# Patient Record
Sex: Female | Born: 1950
Health system: Southern US, Community
[De-identification: ages and names within clinical notes are randomized; demographics above are authoritative.]

## PROBLEM LIST (undated history)

## (undated) DIAGNOSIS — I10 Essential (primary) hypertension: Secondary | ICD-10-CM

## (undated) DIAGNOSIS — IMO0001 Reserved for inherently not codable concepts without codable children: Secondary | ICD-10-CM

## (undated) DIAGNOSIS — G510 Bell's palsy: Secondary | ICD-10-CM

## (undated) DIAGNOSIS — K559 Vascular disorder of intestine, unspecified: Secondary | ICD-10-CM

## (undated) DIAGNOSIS — IMO0002 Reserved for concepts with insufficient information to code with codable children: Secondary | ICD-10-CM

## (undated) DIAGNOSIS — E785 Hyperlipidemia, unspecified: Secondary | ICD-10-CM

## (undated) DIAGNOSIS — N816 Rectocele: Secondary | ICD-10-CM

## (undated) HISTORY — DX: Reserved for inherently not codable concepts without codable children: IMO0001

## (undated) HISTORY — DX: Bell's palsy: G51.0

## (undated) HISTORY — DX: Hyperlipidemia, unspecified: E78.5

## (undated) HISTORY — DX: Rectocele: N81.6

## (undated) HISTORY — DX: Reserved for concepts with insufficient information to code with codable children: IMO0002

## (undated) HISTORY — DX: Essential (primary) hypertension: I10

## (undated) HISTORY — DX: Vascular disorder of intestine, unspecified: K55.9

---
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
## 1972-03-26 HISTORY — PX: APPENDECTOMY: SHX54

## 1988-03-26 HISTORY — PX: TOTAL VAGINAL HYSTERECTOMY: SHX2548

## 1991-04-27 HISTORY — PX: CHOLECYSTECTOMY, LAPAROSCOPIC: SHX56

## 1994-02-23 HISTORY — PX: LAMINECTOMY: SHX219

## 1994-06-25 HISTORY — PX: BACK SURGERY: SHX140

## 1996-03-26 HISTORY — PX: CERVICAL FUSION: SHX112

## 1996-03-26 HISTORY — PX: LAPAROSCOPIC SALPINGO OOPHERECTOMY: SHX5927

## 1997-11-22 ENCOUNTER — Other Ambulatory Visit: Admission: RE | Admit: 1997-11-22 | Discharge: 1997-11-22 | Payer: Self-pay | Admitting: Obstetrics and Gynecology

## 1998-02-16 ENCOUNTER — Encounter: Payer: Self-pay | Admitting: Specialist

## 1998-02-16 ENCOUNTER — Ambulatory Visit (HOSPITAL_COMMUNITY): Admission: RE | Admit: 1998-02-16 | Discharge: 1998-02-16 | Payer: Self-pay | Admitting: Specialist

## 1998-03-21 ENCOUNTER — Ambulatory Visit: Admission: RE | Admit: 1998-03-21 | Discharge: 1998-03-21 | Payer: Self-pay | Admitting: Specialist

## 1998-03-21 ENCOUNTER — Encounter: Payer: Self-pay | Admitting: Specialist

## 1998-11-25 ENCOUNTER — Other Ambulatory Visit: Admission: RE | Admit: 1998-11-25 | Discharge: 1998-11-25 | Payer: Self-pay | Admitting: Obstetrics and Gynecology

## 1999-01-03 ENCOUNTER — Encounter: Payer: Self-pay | Admitting: Gastroenterology

## 1999-01-03 ENCOUNTER — Ambulatory Visit (HOSPITAL_COMMUNITY): Admission: RE | Admit: 1999-01-03 | Discharge: 1999-01-03 | Payer: Self-pay | Admitting: Gastroenterology

## 1999-01-25 HISTORY — PX: LAPAROSCOPIC ABDOMINAL EXPLORATION: SHX6249

## 1999-01-27 ENCOUNTER — Ambulatory Visit (HOSPITAL_COMMUNITY): Admission: RE | Admit: 1999-01-27 | Discharge: 1999-01-27 | Payer: Self-pay | Admitting: Obstetrics and Gynecology

## 1999-09-13 ENCOUNTER — Ambulatory Visit (HOSPITAL_COMMUNITY): Admission: RE | Admit: 1999-09-13 | Discharge: 1999-09-13 | Payer: Self-pay | Admitting: Specialist

## 1999-09-13 ENCOUNTER — Encounter: Payer: Self-pay | Admitting: Specialist

## 2000-01-02 ENCOUNTER — Ambulatory Visit (HOSPITAL_COMMUNITY): Admission: RE | Admit: 2000-01-02 | Discharge: 2000-01-02 | Payer: Self-pay | Admitting: Gastroenterology

## 2000-04-10 ENCOUNTER — Other Ambulatory Visit: Admission: RE | Admit: 2000-04-10 | Discharge: 2000-04-10 | Payer: Self-pay | Admitting: *Deleted

## 2000-11-24 HISTORY — PX: BREAST REDUCTION SURGERY: SHX8

## 2000-12-03 ENCOUNTER — Encounter (INDEPENDENT_AMBULATORY_CARE_PROVIDER_SITE_OTHER): Payer: Self-pay | Admitting: *Deleted

## 2000-12-03 ENCOUNTER — Ambulatory Visit (HOSPITAL_BASED_OUTPATIENT_CLINIC_OR_DEPARTMENT_OTHER): Admission: RE | Admit: 2000-12-03 | Discharge: 2000-12-04 | Payer: Self-pay | Admitting: Plastic Surgery

## 2001-09-01 ENCOUNTER — Ambulatory Visit (HOSPITAL_COMMUNITY): Admission: RE | Admit: 2001-09-01 | Discharge: 2001-09-01 | Payer: Self-pay | Admitting: Neurology

## 2001-09-01 ENCOUNTER — Encounter: Payer: Self-pay | Admitting: Neurology

## 2001-09-16 ENCOUNTER — Encounter: Payer: Self-pay | Admitting: Neurology

## 2001-09-16 ENCOUNTER — Encounter: Admission: RE | Admit: 2001-09-16 | Discharge: 2001-09-16 | Payer: Self-pay | Admitting: Neurology

## 2001-10-03 ENCOUNTER — Encounter: Payer: Self-pay | Admitting: Neurology

## 2001-10-03 ENCOUNTER — Encounter: Admission: RE | Admit: 2001-10-03 | Discharge: 2001-10-03 | Payer: Self-pay | Admitting: Neurology

## 2001-10-20 ENCOUNTER — Encounter: Admission: RE | Admit: 2001-10-20 | Discharge: 2001-10-20 | Payer: Self-pay | Admitting: Neurology

## 2001-10-20 ENCOUNTER — Encounter: Payer: Self-pay | Admitting: Neurology

## 2001-12-24 HISTORY — PX: NECK SURGERY: SHX720

## 2001-12-25 ENCOUNTER — Encounter: Payer: Self-pay | Admitting: Neurosurgery

## 2001-12-25 ENCOUNTER — Ambulatory Visit (HOSPITAL_COMMUNITY): Admission: RE | Admit: 2001-12-25 | Discharge: 2001-12-25 | Payer: Self-pay | Admitting: Neurosurgery

## 2001-12-30 ENCOUNTER — Emergency Department (HOSPITAL_COMMUNITY): Admission: EM | Admit: 2001-12-30 | Discharge: 2001-12-31 | Payer: Self-pay | Admitting: Emergency Medicine

## 2001-12-31 ENCOUNTER — Encounter: Payer: Self-pay | Admitting: Emergency Medicine

## 2002-01-15 ENCOUNTER — Encounter: Payer: Self-pay | Admitting: Neurosurgery

## 2002-01-20 ENCOUNTER — Ambulatory Visit (HOSPITAL_COMMUNITY): Admission: RE | Admit: 2002-01-20 | Discharge: 2002-01-21 | Payer: Self-pay | Admitting: Neurosurgery

## 2002-01-20 ENCOUNTER — Encounter: Payer: Self-pay | Admitting: Neurosurgery

## 2002-02-23 ENCOUNTER — Encounter: Payer: Self-pay | Admitting: Neurosurgery

## 2002-02-23 ENCOUNTER — Encounter: Admission: RE | Admit: 2002-02-23 | Discharge: 2002-02-23 | Payer: Self-pay | Admitting: Neurosurgery

## 2002-04-24 ENCOUNTER — Encounter: Payer: Self-pay | Admitting: Neurosurgery

## 2002-04-24 ENCOUNTER — Encounter: Admission: RE | Admit: 2002-04-24 | Discharge: 2002-04-24 | Payer: Self-pay | Admitting: Neurosurgery

## 2002-06-25 DIAGNOSIS — G510 Bell's palsy: Secondary | ICD-10-CM

## 2002-06-25 HISTORY — DX: Bell's palsy: G51.0

## 2002-09-10 ENCOUNTER — Encounter: Payer: Self-pay | Admitting: Gastroenterology

## 2002-09-10 ENCOUNTER — Encounter: Admission: RE | Admit: 2002-09-10 | Discharge: 2002-09-10 | Payer: Self-pay | Admitting: Gastroenterology

## 2002-10-16 ENCOUNTER — Ambulatory Visit (HOSPITAL_COMMUNITY): Admission: RE | Admit: 2002-10-16 | Discharge: 2002-10-16 | Payer: Self-pay | Admitting: Gastroenterology

## 2002-12-07 ENCOUNTER — Ambulatory Visit (HOSPITAL_COMMUNITY): Admission: RE | Admit: 2002-12-07 | Discharge: 2002-12-07 | Payer: Self-pay | Admitting: Urology

## 2002-12-07 ENCOUNTER — Encounter (INDEPENDENT_AMBULATORY_CARE_PROVIDER_SITE_OTHER): Payer: Self-pay | Admitting: Specialist

## 2002-12-07 ENCOUNTER — Ambulatory Visit (HOSPITAL_BASED_OUTPATIENT_CLINIC_OR_DEPARTMENT_OTHER): Admission: RE | Admit: 2002-12-07 | Discharge: 2002-12-07 | Payer: Self-pay | Admitting: Urology

## 2003-03-09 ENCOUNTER — Ambulatory Visit (HOSPITAL_COMMUNITY): Admission: RE | Admit: 2003-03-09 | Discharge: 2003-03-09 | Payer: Self-pay | Admitting: Gastroenterology

## 2003-04-16 ENCOUNTER — Ambulatory Visit (HOSPITAL_COMMUNITY): Admission: RE | Admit: 2003-04-16 | Discharge: 2003-04-16 | Payer: Self-pay | Admitting: Gastroenterology

## 2003-04-16 ENCOUNTER — Encounter (INDEPENDENT_AMBULATORY_CARE_PROVIDER_SITE_OTHER): Payer: Self-pay | Admitting: *Deleted

## 2003-06-14 ENCOUNTER — Ambulatory Visit (HOSPITAL_COMMUNITY): Admission: RE | Admit: 2003-06-14 | Discharge: 2003-06-15 | Payer: Self-pay | Admitting: Neurosurgery

## 2003-07-26 ENCOUNTER — Encounter: Admission: RE | Admit: 2003-07-26 | Discharge: 2003-07-26 | Payer: Self-pay | Admitting: Neurosurgery

## 2003-08-17 ENCOUNTER — Encounter: Admission: RE | Admit: 2003-08-17 | Discharge: 2003-08-17 | Payer: Self-pay | Admitting: Neurosurgery

## 2003-10-21 ENCOUNTER — Encounter: Admission: RE | Admit: 2003-10-21 | Discharge: 2003-10-21 | Payer: Self-pay | Admitting: Neurosurgery

## 2003-12-09 ENCOUNTER — Encounter: Admission: RE | Admit: 2003-12-09 | Discharge: 2003-12-09 | Payer: Self-pay | Admitting: Neurosurgery

## 2004-01-31 ENCOUNTER — Encounter: Admission: RE | Admit: 2004-01-31 | Discharge: 2004-01-31 | Payer: Self-pay | Admitting: Neurology

## 2004-03-15 ENCOUNTER — Ambulatory Visit (HOSPITAL_COMMUNITY): Admission: RE | Admit: 2004-03-15 | Discharge: 2004-03-16 | Payer: Self-pay | Admitting: Neurosurgery

## 2004-04-18 ENCOUNTER — Encounter: Admission: RE | Admit: 2004-04-18 | Discharge: 2004-04-18 | Payer: Self-pay | Admitting: Neurosurgery

## 2004-07-11 ENCOUNTER — Encounter: Admission: RE | Admit: 2004-07-11 | Discharge: 2004-07-11 | Payer: Self-pay | Admitting: Neurosurgery

## 2004-08-15 ENCOUNTER — Encounter: Admission: RE | Admit: 2004-08-15 | Discharge: 2004-08-15 | Payer: Self-pay | Admitting: Neurosurgery

## 2004-11-08 ENCOUNTER — Encounter: Admission: RE | Admit: 2004-11-08 | Discharge: 2004-11-08 | Payer: Self-pay | Admitting: Neurosurgery

## 2004-11-28 ENCOUNTER — Encounter: Admission: RE | Admit: 2004-11-28 | Discharge: 2004-12-13 | Payer: Self-pay | Admitting: Family Medicine

## 2005-02-18 ENCOUNTER — Encounter: Admission: RE | Admit: 2005-02-18 | Discharge: 2005-02-18 | Payer: Self-pay | Admitting: Family Medicine

## 2005-06-12 ENCOUNTER — Encounter: Admission: RE | Admit: 2005-06-12 | Discharge: 2005-06-12 | Payer: Self-pay | Admitting: Gastroenterology

## 2005-06-23 ENCOUNTER — Encounter: Admission: RE | Admit: 2005-06-23 | Discharge: 2005-06-23 | Payer: Self-pay | Admitting: Gastroenterology

## 2006-08-02 ENCOUNTER — Encounter: Admission: RE | Admit: 2006-08-02 | Discharge: 2006-08-02 | Payer: Self-pay | Admitting: Neurosurgery

## 2006-08-20 ENCOUNTER — Encounter: Admission: RE | Admit: 2006-08-20 | Discharge: 2006-08-20 | Payer: Self-pay | Admitting: Neurosurgery

## 2006-12-25 DIAGNOSIS — K559 Vascular disorder of intestine, unspecified: Secondary | ICD-10-CM

## 2006-12-25 HISTORY — DX: Vascular disorder of intestine, unspecified: K55.9

## 2007-01-12 ENCOUNTER — Observation Stay (HOSPITAL_COMMUNITY): Admission: EM | Admit: 2007-01-12 | Discharge: 2007-01-14 | Payer: Self-pay | Admitting: Emergency Medicine

## 2007-01-13 ENCOUNTER — Encounter (INDEPENDENT_AMBULATORY_CARE_PROVIDER_SITE_OTHER): Payer: Self-pay | Admitting: Gastroenterology

## 2007-01-16 ENCOUNTER — Ambulatory Visit: Payer: Self-pay | Admitting: Gastroenterology

## 2007-02-05 ENCOUNTER — Encounter: Admission: RE | Admit: 2007-02-05 | Discharge: 2007-02-05 | Payer: Self-pay | Admitting: Gastroenterology

## 2007-06-13 ENCOUNTER — Encounter: Admission: RE | Admit: 2007-06-13 | Discharge: 2007-06-13 | Payer: Self-pay | Admitting: Gastroenterology

## 2007-12-07 ENCOUNTER — Encounter: Admission: RE | Admit: 2007-12-07 | Discharge: 2007-12-07 | Payer: Self-pay | Admitting: Family Medicine

## 2008-01-19 ENCOUNTER — Encounter: Admission: RE | Admit: 2008-01-19 | Discharge: 2008-01-19 | Payer: Self-pay | Admitting: Otolaryngology

## 2008-10-19 ENCOUNTER — Encounter: Admission: RE | Admit: 2008-10-19 | Discharge: 2008-10-19 | Payer: Self-pay | Admitting: Gastroenterology

## 2010-08-08 NOTE — Discharge Summary (Signed)
[name], [name]:  [id number]   MEDICAL RECORD NO.:  [id number]          PATIENT TYPE:  INP   LOCATION:  1529                         FACILITY:  [facility]   PHYSICIAN:  Ramiro Harvest, MD    DATE OF BIRTH:  [birth year]   DATE OF ADMISSION:  01/11/2007  DATE OF DISCHARGE:  01/14/2007                               DISCHARGE SUMMARY   DISCHARGE DIAGNOSES:  1. Ischemic colitis.  2. Hypokalemia, secondary to diarrhea and hydrochlorothiazide.  3. Hypertension.  4. Hyperlipidemia.   DISCHARGE MEDICATIONS:  1. Lipitor 10 mg p.o. daily.  2. Fish oil cap, one p.o. daily.  3. Multivitamin one tab p.o. daily.   DISPOSITION:  1. The patient is to schedule a follow-up appointment with Dr. Anselmo Rod in two weeks.  2. She is also to schedule an appointment with her primary care      physician, Dr. Al Decant. Janey Greaser, in the next two weeks.  On      followup a basic metabolic profile needs to be checked to monitor      the patient's potassium level and replete the patient's potassium      as needed.  The patient has also been advised not to take      hydrochlorothiazide, secondary to ischemic colitis and also to      avoid NSAIDs.  On followup, the primary care physician needs to      reassess the patient's blood pressure and treat her blood pressure      as needed.  She probably needs to avoid diuretics.   PROCEDURES PERFORMED:  1. A CT of the abdomen and pelvis was performed on January 12, 2007,      which showed a non-distended descending colon with findings      suggestive of circumferential submucosal thickening, suggestive of      mild colitis without surrounding inflammation.  Evaluation was      limited by under-distention.  No inflammation of the surrounding      fat planes.  2. A colonoscopy was done on January 13, 2007, which showed severe      ischemic changes on the left side, about 50 cm to 80 cm.  Biopsies      were done and a small  internal hemorrhoid as well.  The plan was      for the patient to have a bland diet.  No NSAIDs.  To stop      hydrochlorothiazide and to follow up as an outpatient in two weeks.   CONSULTATIONS:  A gastroenterology consultation was done with Dr. Charna Elizabeth.   HISTORY:  Ms. Lindsay Thompson is a 60 year old female with a history of  hypertension, hyperlipidemia and obesity, who presented to the emergency  department with complaints of abdominal pain and blood per rectum.  The  patient stated that on the day of admission she started to have some  crampy abdominal discomfort.  It felt like she needed to have a bowel  movement.  The patient initially had a normal bowel movement  and  subsequently had several bowel movements with bright red blood.  The  patient went to the [facility].  She was evaluated and subsequently  sent to the emergency room.  In the emergency room the patient was  evaluated and found to be afebrile and hemodynamically stable.  Her  hemoglobin was stable.  She had a CT of the abdomen that showed a  descending colon which was non-distended, with submucosal thickness  suggestive of colitis without inflammation of adjacent fat planes.  Medicine was called for evaluation and admission.   At the time of evaluation the patient was alert and oriented, in mild  distress, secondary to ongoing abdominal discomfort.  She said she had  been on some antibiotics approximately one month ago.  Denied any recent  travel.  No unusual food intake.  Because of the patient's resistant  symptoms, admission was deemed necessary for further evaluation and  treatment.   PHYSICAL EXAMINATION:  VITAL SIGNS:  Temperature 98.9 degrees, blood  pressure 130/82, pulse 76, respirations 15, saturation  98% on room air.  GENERAL:  The patient was alert and oriented, in mild to moderate  distress, secondary to abdominal discomfort.  HEENT:  Normocephalic and atraumatic.  Pupils equal, round,  reactive to  light.  Extraocular movements intact.  Sclerae anicteric.  NECK:  Supple, no lymphadenopathy.  LUNGS:  Clear to auscultation bilaterally, without wheezes or rhonchi.  CARDIOVASCULAR:  A regular rate and rhythm.  No murmurs, rubs or  gallops.  ABDOMEN:  Soft, positive bowel sounds.  No distention.  Tenderness to  palpation in the left lower quadrant.  EXTREMITIES:  No clubbing, cyanosis or edema.   LABORATORY DATA:  White count 13.1, hemoglobin 15.6, hematocrit 44.8,  platelet count 205, ANC 11.3.  A comprehensive metabolic profile had a  sodium of 139, potassium 4, chloride 101, bicarbonate 27, BUN 16,  creatinine 0.9, glucose 116.  Bilirubin 1.6, alkaline phosphatase 60,  AST 45, ALT 33, total protein 6.1, albumin 3.6, calcium 9.9, lipase of  19.  Urinalysis was yellow, clear with specific gravity of 1.025, pH of  5,  urine glucose negative, bilirubin negative, ketones negative, blood  trace, protein negative, urobilinogen 0-2, nitrite negative, leukocytes  negative.  A mono spot test was negative.   HOSPITAL COURSE:  #1 - ISCHEMIC COLITIS:  The patient was admitted to  the hospital initially for a possible ischemic colitis, versus  infectious colitis.  The patient was initially placed on ciprofloxacin  and Flagyl.  The patient was hydrated with IV fluids.  During her  hospitalization the patient's abdominal symptoms improved daily.  The  patient started to have decreased amount of bright red blood per rectum.  The patient's blood pressure medications were held during this time.  A  gastroenterology consultation was performed.  The patient was seen by  gastroenterology.  Dr. Loreta Ave saw the patient and took the patient for a  colonoscopy, which was done on January 13, 2007.  It was found that the  patient had some extensive ischemic colitis.  The plan was to place the  patient on a bland diet.  The patient was to discontinue her  hydrochlorothiazide and also avoid NSAIDs.   The patient improved during  the hospitalization.  On the day of discharge the patient was in a  stable and improved condition.  The patient was to follow up as an  outpatient with Dr. Loreta Ave in two weeks.  Also to follow up with her  primary  care physician in the next one to two weeks.  Stool cultures and  blood cultures were also obtained during the hospitalization and  returned back as negative.   #2 - HYPOKALEMIA:  During the hospitalization the patient was found to  be hypokalemic.  Her potassium was repleted.  It was felt that the  hypokalemia was multifactorial in nature, likely secondary to her  diuretic of hydrochlorothiazide as well as her diarrhea.  The patient's  potassium was repleted prior to discharge from the hospital.  The  patient's primary care physician will need to check a basic metabolic  profile on followup, to monitor the patient's potassium and renal  function and replete her potassium as needed.   #3 - HYPERTENSION:  The patient's blood pressure was well-controlled  throughout the hospitalization.  The patient was not restarted on any of  her antihypertensive medications.  The patient was discharged home  without her antihypertensive medications.  She will follow up with her  primary care physician and will defer antihypertensive treatment to her  PCP.  The patient was instructed per GI to discontinue the  hydrochlorothiazide, secondary to her ischemic colitis.   #4 - HYPERLIPIDEMIA:  Stable.  The patient was maintained on Lipitor  throughout the hospitalization.   CONDITION ON DISCHARGE:  On the day of discharge the patient was in  stable and improved condition.  Her vital signs on discharge revealed  that the patient had a temperature of 98.1 degrees, blood pressure  133/79, pulse 83, respirations 18, saturation 98% on room air.   DISCHARGE LABORATORY DATA:  BMET:  Sodium 140, potassium 3.4, chloride  109, bicarbonate 26, BUN 4, creatinine 0.72, glucose 93,  calcium 8.3.  CBC on the day of discharge:  White count 7.3, hemoglobin 12.1,  hematocrit 34.6, platelet count 137.  C. difficile toxin was negative.   It has been a pleasure taking care of Ms. Lindsay Thompson.      Ramiro Harvest, MD  Electronically Signed     DT/MEDQ  D:  01/14/2007  T:  01/15/2007  Job:  [id number]   cc:   Anselmo Rod, M.D.  Fax: [phone]   Al Decant. Janey Greaser, MD  Fax: [phone]

## 2010-08-08 NOTE — H&P (Signed)
NAMEOMNI, DUNSWORTH NO.:  [id number]   MEDICAL RECORD NO.:  [id number]          PATIENT TYPE:  EMS   LOCATION:  ED                           FACILITY:  [facility]   PHYSICIAN:  Deirdre Peer. Polite, M.D. DATE OF BIRTH:  [birth year]   DATE OF ADMISSION:  01/11/2007  DATE OF DISCHARGE:                              HISTORY & PHYSICAL   CHIEF COMPLAINT:  Abdominal pain with blood per rectum.   HISTORY OF PRESENT ILLNESS:  A 60 year old female with known history of  hypertension, high cholesterol, and obesity who presents to the ED with  the above complaint.  The patient had started today with some crampy  abdominal discomfort that felt like she needed to have a bowel movement.  She had a normal bowel movement and subsequently had severe bowel  movements with bright red blood.  The patient went to the walk-in  clinic, was evaluated and was subsequently sent to the ED.  In the ED,  the patient was evaluated.  She is afebrile.  Hemodynamically stable.  Hemoglobin stable.  She had a CT of the abdomen that showed descending  colon nondistended with a submucosal thickness suggestive of colitis  without inflammation of adjacent fat plane.   LABORATORY DATA:  White count 13 with a left shift.  BMET unremarkable.  UA essentially unremarkable.   Medicine was called for evaluation and admission.   At the time of my evaluation, the patient was alert and oriented in mild  distress secondary to some ongoing abdominal discomfort.  She states  that she has been on some antibiotics approximately a month ago,  otherwise no travel.  No unusual food intake.  Because of her persistent  symptoms, admission is deemed necessary for further evaluation and  treatment.   PAST MEDICAL HISTORY:  As stated above, significant for hypertension,  high cholesterol.   CURRENT MEDICATIONS:  1. Lisinopril/HCT, she is unsure of the dose.  2. Lipitor 20 mg daily.  3. Multivitamin daily.  4. Fish Oil  daily.   PAST SURGICAL HISTORY:  Cholecystectomy in 1993.  Appendectomy at the  age of 77.  Right oophorectomy in the 1990's, four neck surgeries, two  lower back surgeries, hysterectomy in 1991.   ALLERGIES:  Intolerant to [facility].   FAMILY HISTORY:  Noncontributory.   PHYSICAL EXAMINATION:  GENERAL:  Alert and oriented in mild to moderate  distress secondary to abdominal discomfort.  VITAL SIGNS:  The patient is afebrile, temperature 98.9, blood pressure  130/82, pulse 76, respiratory rate 15, saturating 98%.  HEENT:  Unremarkable.  CHEST:  Clear to auscultation bilaterally without rales, rhonchi, or  rub.  HEART:  Regular rhythm, no S3.  ABDOMEN:  Left lower quadrant tenderness to palpation.  No mass  appreciated.  EXTREMITIES:  Without cyanosis, clubbing, or edema.  Data as stated in  the HPI.   ASSESSMENT:  1. Left lower quadrant pain, colitis seen on CT.  Differential      includes ischemia versus infectious, less likely to be inflammatory      as the patient states that she has  had normal colonoscopy 4 years      ago.  2. Leukocytosis 13,000.  3. Hypertension.  4. Hypercholesterolemia.  5. Obesity.   PLAN:  I recommend the patient to be admitted to the medicine floor bed.  She will be given IV fluids and analgesia.  The patient will more than  likely require a GI evaluation.  We will cover with empiric antibiotics  at this time.      Deirdre Peer. Polite, M.D.  Electronically Signed     RDP/MEDQ  D:  01/12/2007  T:  01/13/2007  Job:  [id number]

## 2010-08-11 NOTE — Op Note (Signed)
NAME:  Lindsay Thompson, Lindsay Thompson                           ACCOUNT NO.:  [id number]   MEDICAL RECORD NO.:  [id number]                   PATIENT TYPE:  AMB   LOCATION:  ENDO                                 FACILITY:  [facility]   PHYSICIAN:  Anselmo Rod, M.D.               DATE OF BIRTH:  Nov 22, 1950   DATE OF PROCEDURE:  10/16/2002  DATE OF DISCHARGE:                                 OPERATIVE REPORT   PROCEDURE PERFORMED:  Colonoscopy.   ENDOSCOPIST:  Anselmo Rod, M.D.   INSTRUMENT USED:  Olympus video colonoscope.   INDICATION FOR PROCEDURE:  History of abdominal pain in the left lower  quadrant, abnormal CT scan showing thickening of the sigmoid colon, and  family history of malignant polyps in a 60 year old white female.  Rule out  colonic polyps, masses, etc.   PREPROCEDURE PREPARATION:  Informed consent was procured from the patient.  The patient had fasted for eight hours prior to the procedure and prepped  with a bottle of magnesium citrate and a gallon of GoLYTELY the night prior  to the procedure.   PREPROCEDURE PHYSICAL:  VITAL SIGNS:  The patient had stable vital signs.  NECK:  Supple.  CHEST:  Clear to auscultation.  S1, S2 regular.  ABDOMEN:  Soft with normal bowel sounds.   DESCRIPTION OF PROCEDURE:  The patient was placed in the left lateral  decubitus position and sedated with 70 mg of Demerol and 7 mg of Versed  intravenously.  Once the patient was adequately sedate and maintained on low-  flow oxygen and continuous cardiac monitoring, the Olympus video colonoscope  was advanced from the rectum to the cecum.  No masses, polyps, erosions,  ulcerations, or diverticula were seen.  The terminal ileum appeared normal.  The appendiceal orifice and the ileocecal valve were clearly visualized and  photographed.  There was no abnormality noted in the rectosigmoid area or  the sigmoid colon.  Small internal hemorrhoids were seen on retroflexion in  the rectum.  No masses,  polyps, or diverticulosis were present.  The patient  tolerated the procedure well without complications.   IMPRESSION:  1. Normal colonoscopy up to the terminal ileum except for small internal     hemorrhoids.  2. No masses, polyps, or diverticulosis noted.   RECOMMENDATIONS:  1. A high-fiber diet with liberal fluid intake has been recommended.  2. Gynecologic evaluation with Andres Ege, M.D., is advised.  3.     Repeat colorectal cancer screening is recommended in the next five years     unless the patient develops any abnormal symptoms in the interim.  4. Outpatient follow-up as the need arises in the future.  Anselmo Rod, M.D.    JNM/MEDQ  D:  10/16/2002  T:  10/17/2002  Job:  [id number]   cc:   Al Decant. Janey Greaser, MD  [address]  [place]  [place] [address]  Fax: [phone]   Andres Ege, M.D.  [address]  [place]  [place] [address]  Fax: [phone]

## 2010-08-11 NOTE — Op Note (Signed)
Lindsay Thompson, TIEDT                 ACCOUNT NO.:  [id number]   MEDICAL RECORD NO.:  [id number]          PATIENT TYPE:  INP   LOCATION:  1529                         FACILITY:  [facility]   PHYSICIAN:  Anselmo Rod, M.D.  DATE OF BIRTH:  09-30-1950   DATE OF PROCEDURE:  01/15/2007  DATE OF DISCHARGE:  01/14/2007                               OPERATIVE REPORT   PROCEDURE PERFORMED:  Colonoscopy with multiple cold biopsies.   ENDOSCOPIST:  Anselmo Rod, M.D.   INSTRUMENT USED:  Pentax video colonoscope.   INDICATIONS FOR PROCEDURE:  Rectal bleeding in a 60 year old white  female, rule out colonic polyps, masses, ischemia, etc.   PREPROCEDURE PREPARATION:  Informed consent was procured from the  patient.  The patient fasted for four hours prior to the procedure and  prepped with a bottle of magnesium citrate and a gallon of GoLYTELY the  night prior to the procedure.  Risks and benefits of the procedure  including a 10% miss rate of cancer and polyp were discussed with the  patient as well.   PREPROCEDURE PHYSICAL:  The patient had stable vital signs.  Neck  supple.  Chest clear to auscultation.  S1, S2 regular.  Abdomen soft  with normal bowel sounds.   DESCRIPTION OF PROCEDURE:  The patient was placed in the left lateral  decubitus position and sedated with 125 mcg of Fentanyl and 12 mg of  Versed given intravenously in slow incremental doses.  Once the patient  was adequately sedated and maintained on low-flow oxygen and continuous  cardiac monitoring, the Pentax video colonoscope was advanced from the  rectum to the terminal ileum with difficulty.  There was significant  amount of residual stool in the colon.  Multiple washes were done. Small  lesions could be missed.  There was evidence of ischemic changes from 50-  80 cm in the watershed area. Edematous colonic mucosa with patchy  ulceration was noted.  Multiple biopsies were done to confirm the  diagnosis. The appendiceal  orifice and cecal valve were visualized and  photographed.  The terminal ileum appeared healthy without lesions.  Retroflexion in the rectum revealed no abnormalities.  The patient  tolerated the procedure well without immediate complications.   IMPRESSION:  1. Ischemic colitis with severe ulceration and edema of the colonic      mucosa from 50-80 cm.  Multiple cold biopsies done.  2. Normal terminal ileum.  3. Large amount of residual stool in the colon.  Multiple washes done.      Small lesions could be missed.   RECOMMENDATIONS:  1. Await pathology results.  2. Discontinue hydrochlorothiazide.  3. Monitor serial CBCs.  4. Lower residue bland diet.  5. Outpatient follow-up in the next week for further recommendations.      Anselmo Rod, M.D.  Electronically Signed     JNM/MEDQ  D:  01/15/2007  T:  01/15/2007  Job:  [id number]   cc:   Al Decant. Janey Greaser, MD  Fax: [phone]

## 2010-08-11 NOTE — Op Note (Signed)
NAMEDEMETRIC, PARSLOW                           ACCOUNT NO.:  [id number]   MEDICAL RECORD NO.:  [id number]                   PATIENT TYPE:  OIB   LOCATION:  3014                                 FACILITY:  [facility]   PHYSICIAN:  Donalee Citrin, M.D.                     DATE OF BIRTH:  [birth year]   DATE OF PROCEDURE:  06/14/2003  DATE OF DISCHARGE:                                 OPERATIVE REPORT   PREOPERATIVE DIAGNOSIS:  C5 radiculopathy from cervical spondylosis and  ruptured disc at C4-C5, left.   POSTOPERATIVE DIAGNOSIS:  C5 radiculopathy from cervical spondylosis and  ruptured disc at C4-C5, left.   PROCEDURE:  Removal of hardware C5-C6, anterior cervical discectomy and  fusion at C4-C5 using a 6 mm patellar wedge and a 25 mm Atlantis Vision  plate with two 13 mm rescue screws and two 13 mm regular screws.   SURGEON:  Donalee Citrin, M.D.   ASSISTANT:  Kathaleen Maser. Pool, M.D.   ANESTHESIA:  General endotracheal anesthesia.   HISTORY OF PRESENT ILLNESS:  The patient is a very pleasant 60 year old  female who was 1 1/2 years out from an ACDF at C5-C6 who developed  progressive worsening neck pain with radiation to her left shoulder and to  her deltoid consistent with an C5 radiculopathy.  The patient has been  refractory to all forms of conservative treatment with weakness developing  in her deltoid and biceps at about 4+ out of 5.  Preoperative imaging showed  severe break down and spondylosis with a ruptured disc at C4-C5 compressing  both C5 neural foramina.  The patient was well fused at the C5-C6 level and  in order to perform an adequate anterior cervical discectomy and fusion at  C4-C5 necessitated removal of the plate at E4-V4.   DESCRIPTION OF PROCEDURE:  The patient was brought to the OR and was given  general anesthesia, positioned supine with the neck in slight extension and  5 pounds of halter traction.  The right side of the neck was prepped and  draped in the usual sterile  fashion.  Her old incision was identified.  Fluoroscopy confirmed this to be adequate for removal of plate and for the  performance of the C4-C5 ACDF.  This was incised with a scalpel and then  blunt dissection was used to dissect out superficial layer of the platysma.  The platysma was dissected longitudinally.  Dissection to the scar was  continued.  The avascular plane between the sternocleidomastoid and strap  muscles was developed and the prevertebral fascia, the carotid was  identified and maintained laterally, the esophagus was identified and  reflected medially.  The Kitners were used to dissect out the old plate.  Facet screws were loosened, the screws were removed and the plate was  removed at C5-C6.  Interoperative x-ray confirmed localization of the C4-C5  disc space and after  the longus colli was reflected laterally and self-  retaining retractor was placed, the annulotomy was made with a #15 blade  scalpel.  Pituitary rongeurs were used to clean out the anterior margin of  the annulus and then the disc space was scraped off and the anterior  osteophyte C4 vertebral body was bit with a 3 mm Kerrison punch and then a  high speed drill was used to drill down the endplates of the posterior  annulus and the posterior complexes.  Then, the operating microscope was  draped and brought onto the field and the microscopic illumination was made  and the posterior annulus was removed with a 2 mm Kerrison punch exposing  the posterior longitudinal ligament.  There was a large osteophytic complex  coming off the midline of the C4 vertebral body as well as severe uncinate  hypertrophy compressing the left C6 nerve root.  After the posterior  longitudinal ligament was removed then several disc fragments were removed  decompressing the thecal sac and the left C5 nerve root, the neural foramina  was widely opened up and the C5 nerve root was identified decompressing the  neural foramina.  This  was explored with a nerve hook and noted to have no  further stenosis.  Attention was turned to decompressing the right side.  The remainder of the posterior longitudinal ligament was removed to identify  the proximal stenosis of the right C5 nerve root.  This nerve root  was  completely asymptomatic, however, on the MRI did have some spondylitic  compression so the proximal aspect was identified, explored with an angled  nerve hook and no further stenosis.  The endplates were scraped.  A 6 mm  patellar wedge was sized, selected, and inserted, 1 mm deep to the anterior  vertebral body line after 2 mm were cut off the depth.  Then a 25 mm  Atlantis Vision plate was selected and two 13 mm rescue screws were inserted  at the previous screw holes in the C5 vertebral body then two new screw  holes were drilled and two screws were placed, regular 13, at the C4  vertebral body.  The set screws were tightened down and locked.  Fluoroscopy  confirmed good position of the plate, screws, and bone graft.  The wound was  copiously irrigated and meticulous hemostasis was maintained.  The platysma  was reapproximated with 3-0 interrupted Vicryl and skin was closed with  running 4-0 subcuticular.  Benzoin and Steri-Strips were applied.  The  patient went to the recovery room in stable condition.  At the end of the  case, needle, sponge, and instrument counts were correct.                                               Donalee Citrin, M.D.    GC/MEDQ  D:  06/14/2003  T:  06/14/2003  Job:  [id number]

## 2010-08-11 NOTE — Op Note (Signed)
   Lindsay Thompson, RAPHAEL                           ACCOUNT NO.:  [id number]   MEDICAL RECORD NO.:  [id number]                   PATIENT TYPE:  AMB   LOCATION:  [facility]                                 FACILITY:  [facility]   PHYSICIAN:  Bertram Millard. Dahlstedt, M.D.          DATE OF BIRTH:  16-Sep-1950   DATE OF PROCEDURE:  12/07/2002  DATE OF DISCHARGE:                                 OPERATIVE REPORT   PREOPERATIVE DIAGNOSES:  Painful bladder, frequency.   POSTOPERATIVE DIAGNOSIS:  Possible IC (interstitial cystitis).   PRINCIPAL PROCEDURE:  Cystoscopy, hydrodistention, bladder biopsy,  installation of Pyridium/Marcaine in bladder.   SURGEON:  Bertram Millard. Dahlstedt, M.D.   ANESTHESIA:  General.   COMPLICATIONS:  None.   BRIEF HISTORY:  A 60 year old female with persistent symptoms of pelvic  pain, frequency and urgency. It was felt that the patient may have IC.  It  was recommended that she undergo anesthetic cystoscopy, hydrodistention,  bladder biopsy.  The risks and complications of this procedure have been  discussed with the patient and her husband. They agree to proceed.   DESCRIPTION OF PROCEDURE:  The patient was administered preoperative IV  antibiotics and taken to the operating room where a general anesthetic was  administered. She was placed in the dorsal lithotomy position. Genitalia and  perineum were prepped and draped. A 22 French panendoscope was placed in the  bladder. The bladder showed increased vascularity with no evident lesions.  The ureteral orifices were normal. There were no trabeculations.   The bladder was then filled with approximately 550 mL of water to capacity.  This was left indwelling for 1-2 minutes. After allowing the bladder to  decompress, multiple glomerulations and small bleeders were seen. Pictures  were taken. Biopsies were then taken in random fashion from the right  lateral wall, left lateral wall and posterior wall. These areas were  cauterized. The bladder was then drained, and the bladder was then treated  with Marcaine/Pyridium solution. The patient tolerated the procedure well.  She was awakened and taken to PACU in stable condition.                                               Bertram Millard. Dahlstedt, M.D.    SMD/MEDQ  D:  12/07/2002  T:  12/07/2002  Job:  [id number]

## 2010-08-11 NOTE — Op Note (Signed)
NAMEMERRIEL, ZINGER NO.:  [id number]   MEDICAL RECORD NO.:  [id number]          PATIENT TYPE:  OIB   LOCATION:  2899                         FACILITY:  [facility]   PHYSICIAN:  Donalee Citrin, M.D.        DATE OF BIRTH:  06/04/1950   DATE OF PROCEDURE:  03/15/2004  DATE OF DISCHARGE:                                 OPERATIVE REPORT   PREOPERATIVE DIAGNOSIS:  Pseudoarthrosis and neck pain, failed fusion at C4-  5 anteriorly.   PROCEDURES:  1.  Posterior cervical fusion C4-5 using the Vertex lateral mass pedicle      screw system.  2.  Iliac crest harvest on the left iliac crest for autologous bone graft      harvest.  3.  Utilization of BMP bone growth regenerator and arthrodesis C4-5.   SURGEON:  Donalee Citrin, M.D.   Threasa HeadsYetta Barre.   ANESTHESIA:  General endotracheal anesthesia.   INDICATIONS FOR PROCEDURE:  The patient is a very pleasant 60 year old  female who has had longstanding neck pain.  The patient had two previous  neck surgeries.  She had had a C5-6 and C6-7 fusion a few years prior.  A  year ago, had a C4-5 anterior cervical discectomy and fusion and had six  months of relief of neck pain.  However, over the last three to six months  has had progressive worsening of neck pain with imaging showing resorption  of the C4-5 bone and progressive kyphosis in C4-5.  Due to the patient's  pseudoarthrosis, apparent on imaging at C4-5 and a progressive neck pain  without radiculopathy, the patient was recommended a posterior cervical  fusion with autologous iliac crest bone graft harvest.  Risks and benefits  were explained.  The patient understands and agrees to proceed forward.   DESCRIPTION OF PROCEDURE:  The patient was brought to the OR, was induced  under general anesthesia, positioned prone in pins, the neck in slight  flexion.  The back side of his neck as well as the left iliac crest were  prepped and draped in the usual sterile fashion and attention  was first  taken to harvesting the iliac crest. Approximately four fingerbreadths off  the midline an incision was made over the left posterior superior iliac  spine.  Bovie electrocautery was used to dissect through the subcutaneous  tissue and subfascial dissection carried out on the posterior aspect of the  iliac crest.  Then using a curved chisel, the lateral cortical surface was  removed and then several large morsels of cancellous bone was removed from  within the marrow of the iliac crest.  This was performed with gouges and  Leksell rongeur.  After adequate bone had been harvested, dry Gelfoam was  overlayed on top of the harvest site and the muscle and fascia  reapproximated with 0 Vicryl interrupted sutures, subcutaneous tissue with 2-  0 Vicryl interrupted sutures after copious irrigation and the skin was  closed with running 4-0 subcuticular and this was dressed.  Then attention  was taken to the posterior cervical wound.  The posterior cervical spine was  exposed with a midline incision.  Bovie electrocautery was used to incise  the fascia and subperiosteal dissection carried out in the lamina of C3, 4,  and 5.  Intraoperative radiograph initially confirmed the C5-6 disc space  and attention was taken to the disc space and interspace above this.  The  lateral masses at C3-4 were aggressively exposed on both sides, then the  undersurface of the facet joints were drilled out with a high speed drill.  Then pilot holes were drilled at the medial inferior aspect of the lateral  mass.  Then using the TPS with a 14 mm drill bit, holes were cannulated,  aiming from the inferomedial pilot hole to the superolateral aspect of the  lateral mass.  This was probed with a depth gauge and a 7 Rhoton and noted  to be competent from within the lateral mass hole and then the neocortex was  tapped and 14 mm screws were inserted at both C4, C5 bilaterally.  All  screws had excellent purchase.   Aggressive decortication was carried in  lateral gutters immediately after this.  Then the morselized cancellous bone  from the iliac crest was then placed under the facet joint as well as rolled  up in BMP sponges out in lateral gutters.  Then the rod was fashion, cut and  inserted, __________ tightened down at C4-5, then the iliac crest autograft  was packed medially after aggressive decortication of the lamina medially  and the remainder laterally.  Then, prior to bone graft placement, this was  copiously irrigated and the muscle fascia reapproximated with 0 interrupted  Vicryl and the subcutaneous tissue with 2-0 interrupted Vicryl and the skin  was closed with running 4-0 subcuticular.  Benzoin and Steri-Strips applied.  The patient went to the recovery room in stable condition.  At the end of  the case sponge, needle and instrument counts were correct.       GC/MEDQ  D:  03/15/2004  T:  03/16/2004  Job:  [id number]

## 2010-08-11 NOTE — Op Note (Signed)
NAMEHODAYA, CURTO                           ACCOUNT NO.:  [id number]   MEDICAL RECORD NO.:  [id number]                   PATIENT TYPE:  OIB   LOCATION:  2861                                 FACILITY:  [facility]   PHYSICIAN:  Donalee Citrin, MD                       DATE OF BIRTH:  1950/05/06   DATE OF PROCEDURE:  01/20/2002  DATE OF DISCHARGE:                                 OPERATIVE REPORT   PREOPERATIVE DIAGNOSES:  C6 radiculopathy, bilateral left greater than  right, cervical spondylosis C5-6, with bilateral foraminal stenosis C5-6.   POSTOPERATIVE DIAGNOSES:  C6 radiculopathy, bilateral left greater than  right, cervical spondylosis C5-6, with bilateral foraminal stenosis C5-6.   OPERATION PERFORMED:  Anterior cervical diskectomy and fusion at C5-6 using  a 6 mm patellar wedge and a 23 mm Atlantis plate with four 13 mm screws.   SURGEON:  Donalee Citrin, MD   ASSISTANT:  Reinaldo Meeker, M.D.   ANESTHESIA:  General endotracheal.   INDICATIONS FOR PROCEDURE:  The patient is a very pleasant 60 year old  female who has had longstanding neck and bilateral arm pain, left greater  than right radiating  down to her thumb and forefinger consistent with a C6  radiculopathy.  The patient has failed conservative treatment including  physical therapy. X-rays showed cervical spondylosis, bilateral foraminal  stenosis.  The patient was recommended diskectomy and fusion. Discussion was  held regarding the risks and benefits of  surgery with her.  She understands  and agrees.   DESCRIPTION OF PROCEDURE:  The patient was brought to the operating room,  was induced under anesthesia and  positioned supine with the neck in slight  extension with 5 pounds of halter traction. The right side of the neck was  prepped and draped in the usual sterile fashion.  A curvilinear incision was  made just off the midline to anterior border of the sternocleidomastoid,  superficial to the latissimus.  Dissection  carried down to __________  sternocleidomastoid muscle and strap muscles and developed down to  prevertebral fascia. The prevertebral fascia was divided using a Kitner, the  longus colli muscle incised laterally.  Intraoperative x-ray confirmed  localization of C5-C6 disk space.  The laminotomy was then done with 15  blade scalpel.  Pituitary rongeurs were used to remove the anterior margin  of the annulus and using the up-biting curette and high speed drill,  __________ drill down to the posterior longitudinal ligament and posterior  annulus.  This was all removed piecemeal fashion with 2 mm Kerrison punch.  Then posterior longitudinal ligament was removed in piecemeal fashion with  the 2 mm Kerrison punch.  There was noted to be spinal stenosis coming off  the proximal aspect of the C6 nerve root with muscle hypertrophied  bilaterally.  This was radically decompressed  both neural foramen and both  C6 nerve  roots were widely decompressed.  Angled nerve hook was passed and  each neural foramen noted to be widely patent at the end of diskectomy. The  endplates were then again scraped.  The wound was copiously irrigated and a  6 mm patellar wedge was sized, selected and inserted under compression.  Then the anterior vertebral body  was prepared to receive the plate.  A 22  mm Atlantis plate was sized and selected, four 13 mm __________ screws were  inserted after being drilled and tapped.  All screws had excellent purchase.  The plate was tightened down.  The wound was copiously irrigated and  meticulous hemostasis was maintained again.  Postoperative x-ray confirmed  good position  of the plates, screws and bone graft.  Then the platysma was reapproximated  with 3-0 interrupted Vicryl.  The skin was closed with a running 4-0  subcuticular.  Benzoin and Steri-Strips applied.  The patient was then  transferred to the recovery room in stable condition.  At the end of the  case, needle and  sponge counts correct.                                                Donalee Citrin, MD    GC/MEDQ  D:  01/20/2002  T:  01/20/2002  Job:  [id number]

## 2010-08-11 NOTE — Op Note (Signed)
NAME:  Lindsay Thompson, Lindsay Thompson                           ACCOUNT NO.:  [id number]   MEDICAL RECORD NO.:  [id number]                   PATIENT TYPE:  AMB   LOCATION:  ENDO                                 FACILITY:  [facility]   PHYSICIAN:  Anselmo Rod, M.D.               DATE OF BIRTH:  1951/03/07   DATE OF PROCEDURE:  04/16/2003  DATE OF DISCHARGE:                                 OPERATIVE REPORT   PROCEDURE PERFORMED:  Esophagogastroduodenoscopy with biopsies.   ENDOSCOPIST:  Anselmo Rod, M.D.   INSTRUMENT USED:  Olympus video panendoscope.   INDICATIONS FOR PROCEDURE:  This 60 year old white female with a history of  abdominal pain and guaiac-positive stools.  Rule out peptic ulcer disease,  esophagitis, gastritis, etc.  The patient had ongoing epigastric pain  despite the usual PPIs twice a day.   PREPROCEDURE PREPARATION:  Informed consent was procured from the patient.  The patient was fasted for eight hours prior to the procedure.   PREPROCEDURE PHYSICAL:  VITAL SIGNS:  The patient had stable vital signs.  NECK:  Supple.  CHEST:  Clear to auscultation.  S1 and S2 regular.  ABDOMEN:  Soft with normal bowel sounds.   DESCRIPTION OF THE PROCEDURE:  The patient was placed in the left lateral  decubitus position, sedated with 100 mg of Demerol and 10 mg of Versed in  slow, incremental doses.  Once the patient was adequately sedated and  maintained on low-flow oxygen and continuous cardiac monitoring, the Olympus  video panendoscope was advanced through the mouthpiece over the tongue into  the esophagus under direct vision.  The entire esophagus appeared normal  with no evidence of ring, stricture, masses, esophagitis, or Barrett's  mucosa.  The scope was then advanced into the stomach.  The entire gastric  mucosa appeared normal.  Mild erythematous changes noted in the small bowel  mucosa.  Random biopsies were done.   IMPRESSION:  1. Normal EGD except for mild erythematous changes  in the proximal bowel,     biopsied, and results pending.  2. No ulcers or masses seen.  No source of bleeding identified.   RECOMMENDATIONS:  1. Await pathology results.  2. Avoid all nonsteroidals, including aspirin for now.  3. Continue PPIs as before.  4. Repeat guaiacs on an outpatient basis.  5. Outpatient followup in the next two weeks or earlier if need be.                                               Anselmo Rod, M.D.    JNM/MEDQ  D:  04/16/2003  T:  04/16/2003  Job:  [id number]   cc:   Andres Ege, M.D.  [address]  [place]  [place] [address]  Fax: [phone]   Al Decant. Janey Greaser, MD  [address]  [place]  [place] [address]  Fax: [phone]

## 2010-08-11 NOTE — Op Note (Signed)
[place]  Patient:    [name], [name] Visit Number: [id number] MRN: [id number]          Service Type: DSU Location: [facility] Attending Physician:  Eloise Levels Dictated by:   Lear A. Contogiannis, M.D. Proc. Date: 12/03/00 Admit Date:  12/03/2000                             Operative Report  PREOPERATIVE DIAGNOSIS:  Bilateral macromastia.  POSTOPERATIVE DIAGNOSIS:  Bilateral macromastia.  PROCEDURE:  Bilateral reduction mammoplasties.  SURGEON:  Lysa A. Contogiannis, M.D.  ASSISTANT:  Alethia Berthold, C.F.A.  ANESTHESIA:  General endotracheal.  ANESTHESIOLOGIST:  Janetta Hora. Gelene Mink, M.D.  ESTIMATED BLOOD LOSS:  250 cc.  FLUID REPLACEMENT:  2900 cc crystalloid.  URINE OUTPUT:  450 cc.  COMPLICATIONS:  None.  AMOUNT OF BREAST TISSUE REMOVED:  Right breast, 1040 g; Left breast, 1178 g.  JUSTIFICATION FOR OVERNIGHT STAY:  The patient will be admitted to the [facility] postoperatively for progressive pain control, ambulation, and monitoring of the nipples and breast flaps.  INDICATIONS FOR PROCEDURE:  The patient is a 60 year old Caucasian female who presented with bilateral macromastia along with neck aches, headaches, backaches, shoulder strap groove marks, and pain.  The patient requested to proceed with bilateral reduction mammoplasties at this time.  DESCRIPTION OF PROCEDURE:  The patient was marked in the preop holding area in the pattern of Wise for the future bilateral reduction mammoplasties.  She was then taken back to the operating room and placed on the OR table in the supine position.  After adequate general anesthesia was obtained, the chest was prepped with Betadine and alcohol and draped in a sterile fashion.  The bases of both breasts were injected with 1% lidocaine with epinephrine.  After adequate hemostasis had taken effect, the procedure was begun.  First the right breast reduction was performed.  Using the 42 mm  nipple marker, the nipple was thus marked and incised.  The skin was then de-epithelialized down to the inframammary crease around the nipple in an inferior pedicle pattern. Next, the medial, superior, and lateral skin flaps were elevated down to the chest wall and then slightly off the chest wall for better shaping of the breast.  The excess fat and glandular tissue were removed from the breast in the inferior pedicle pattern.  The nipple was then examined and found to be pink and viable after this dissection.  The wound was irrigated with saline irrigation.  Meticulous hemostasis was obtained with the Bovie electrocautery. The inferior pedicle was then centralized using 3-0 Prolene suture.  A #10 JP flat, fully-fluted drain was then placed into the wound.  The skin flaps were brought together at the inverted T junction with a 2-0 Prolene suture.  The skin incisions were stapled for temporary closure.  Attention was turned to the left breast.  In a similar manner, the nipple was marked with a 42 mm nipple marker.  This was then incised and the skin around the nipple down to the inframammary crease de-epithelialized in the inferior pedicle pattern. The medial, superior, and lateral skin flaps were then elevated down to the chest wall and slightly off the chest wall for better redraping of the breast tissue.  The excess fat and glandular tissue were then dissected free from the inferior pedicle.  The nipple was then examined and found to be pink and viable at the end of this  dissection.  The wound was irrigated with saline irrigation.  Meticulous hemostasis was obtained with the Bovie electrocautery. The inferior pedicle was centralized using 3-0 Prolene suture.  A #10 JP flat, fully-fluted drain was then placed into the wound.  The skin flaps were brought together in the inverted T junction with a 2-0 Prolene suture.  The skin incisions were then stapled for temporary closure.  The breasts  were compared and found to have good shape and symmetry.  Next, the staples were removed and all the incisions were closed, first on the right breast, then on the left.  The incisions were closed with 3-0 Monocryl interrupted and running dermal sutures along with a 4-0 Monocryl running intracuticular stitch on the skin.  The patient was then placed in the upright position.  The locations of the future nipples were then marked on the breast mounds.  The patient was then placed back in supine position.  The area on the right breast for the future nipple was then incised and all that soft tissue excised full-thickness with the Bovie electrocautery.  The nipple-areolar complex was then examined and found to be pink and viable.  It was then brought out into this aperture and sewn in place using 4-0 Monocryl interrupted dermal sutures, followed by a5-0 Monocryl running intracuticular stitch on the skin.  In likewise fashion, the area for the future nipple was then incised on the left breast and the soft tissue excised full-thickness with the Bovie electrocautery.  The nipple was examined and found to be pink and viable.  It was then brought out into this aperture and sewn in place using 4-0 Monocryl interrupted dermal sutures, followed by 5-0 Monocryl running intracuticular stitch on the skin.  The JP drains were sewn in place using 3-0 nylon sutures.  The incisions were dressed with benzoin and Steri-Strips.  The nipples were dressed with bacitracin ointment and Adaptic.  Four by four gauze was then lightly placed over this, and the patient was placed into a light support bra.  There were no complications.  The patient tolerated the procedure well.  She was then extubated and taken to the recovery room in stable condition.  The patient will remain overnight in the [facility] for progressive pain control, ambulation, and monitoring of the nipples and skin flaps.  Discharge is planned for  the morning. Dictated by:   Claudean A. Contogiannis, M.D. Attending Physician:  Eloise Levels DD:  12/03/00 TD:  12/03/00 Job: [id number]

## 2010-08-11 NOTE — Procedures (Signed)
[place]  Patient:    Lindsay Thompson, Lindsay Thompson                        MRN: [id number] Proc. Date: 01/02/00 Adm. Date:  [id number] Attending:  Charna Elizabeth CC:         Al Decant. Janey Greaser, M.D.   Procedure Report  DATE OF BIRTH:  23-Jun-1950  PROCEDURE PERFORMED:  Colonoscopy.  ENDOSCOPIST:  Anselmo Rod, M.D.  INSTRUMENT USED:  Olympus video colonoscope.  INDICATION FOR PROCEDURE:  A 60 year old white female with blood in the stool and a family history of malignant colonic polyps, rule out colonic polyps, masses, hemorrhoids, etc.  PREPROCEDURE PREPARATION:  Informed consent was procured from the patient. The patient was fasted for four hours prior to the procedure and prepped with a bottle magnesium citrate and a gallon of NuLytely the night prior to the procedure.  PREPROCEDURE PHYSICAL:  VITAL SIGNS:  The patient had stable vital signs.  NECK:  Supple.  No JVD, thyromegaly or lymphadenopathy.  CHEST:  Clear to auscultation.  S1 and S2 regular.  No murmur, rub, gallop, rales, rhonchi or wheezing.  ABDOMEN:  Soft with normal abdominal bowel sounds.  DESCRIPTION OF PROCEDURE:  The patient was placed in the left lateral decubitus position and sedated with 60 mg of Demerol and 6 mg of Versed intravenously.  Once the patient was adequately sedated and maintained on low flow oxygen and continuous cardiac monitoring, the Olympus video colonoscope was advanced through the rectum to the cecum without difficulty. Except for small internal hemorrhoids the entire colonic mucosa appeared healthy without erosions, ulcerations, masses or polyps.  IMPRESSION:  Normal colonoscopy except for small internal hemorrhoid.  RECOMMENDATIONS: 1. Considering her family history of malignant polyps, a repeat colonoscopy    is recommended in the next five years. 2. The patient has been advised to increase the fluid and fiber in her diet    to prevent rectal  bleeding from hard stool. 3. Outpatient follow up is advised on a p.r.n. basis. DD:  01/02/00 TD:  01/03/00 Job: [id number]

## 2010-11-30 ENCOUNTER — Other Ambulatory Visit: Payer: Self-pay | Admitting: Family Medicine

## 2010-11-30 DIAGNOSIS — M542 Cervicalgia: Secondary | ICD-10-CM

## 2010-12-08 ENCOUNTER — Ambulatory Visit
Admission: RE | Admit: 2010-12-08 | Discharge: 2010-12-08 | Disposition: A | Discharge status: Home / Self Care | Payer: Self-pay | Source: Ambulatory Visit | Attending: Family Medicine | Admitting: Family Medicine

## 2010-12-08 DIAGNOSIS — M542 Cervicalgia: Secondary | ICD-10-CM

## 2010-12-11 ENCOUNTER — Other Ambulatory Visit: Payer: Self-pay

## 2011-01-02 ENCOUNTER — Other Ambulatory Visit: Payer: Self-pay | Admitting: Neurosurgery

## 2011-01-02 DIAGNOSIS — M5030 Other cervical disc degeneration, unspecified cervical region: Secondary | ICD-10-CM

## 2011-01-02 DIAGNOSIS — M542 Cervicalgia: Secondary | ICD-10-CM

## 2011-01-03 LAB — CBC
HCT: 34.6 — ABNORMAL LOW
HCT: 36.4
HCT: 41.4
HCT: 44.8
Hemoglobin: 12.1
Hemoglobin: 12.6
Hemoglobin: 14.8
MCHC: 34.7
MCHC: 34.8
MCHC: 34.8
MCHC: 34.9
MCV: 87
MCV: 88.2
Platelets: 137 — ABNORMAL LOW
Platelets: 205
RBC: 4.13
RBC: 4.76
RBC: 4.86
RBC: 5.12 — ABNORMAL HIGH
RDW: 12.7
RDW: 12.8
WBC: 13.1 — ABNORMAL HIGH
WBC: 13.7 — ABNORMAL HIGH

## 2011-01-03 LAB — BASIC METABOLIC PANEL
BUN: 4 — ABNORMAL LOW
CO2: 26
CO2: 27
CO2: 28
Calcium: 8.3 — ABNORMAL LOW
Calcium: 8.8
Chloride: 106
Chloride: 106
Creatinine, Ser: 0.75
GFR calc Af Amer: >60
GFR calc Af Amer: >60
GFR calc non Af Amer: >60
Glucose, Bld: 105 — ABNORMAL HIGH
Glucose, Bld: 93
Glucose, Bld: 93
Potassium: 2.9 — ABNORMAL LOW
Potassium: 3.4 — ABNORMAL LOW
Sodium: 139
Sodium: 140

## 2011-01-03 LAB — HEMOGLOBIN AND HEMATOCRIT, BLOOD
HCT: 40
HCT: 40.7
Hemoglobin: 14
Hemoglobin: 14.2

## 2011-01-03 LAB — CULTURE, BLOOD (ROUTINE X 2): Culture: NO GROWTH

## 2011-01-03 LAB — DIFFERENTIAL
Eosinophils Absolute: 0
Eosinophils Relative: 0
Monocytes Absolute: 0.7
Neutrophils Relative %: 86 — ABNORMAL HIGH

## 2011-01-03 LAB — URINALYSIS, ROUTINE W REFLEX MICROSCOPIC
Bilirubin Urine: NEGATIVE
Nitrite: NEGATIVE
Specific Gravity, Urine: 1.025
pH: 5

## 2011-01-03 LAB — COMPREHENSIVE METABOLIC PANEL
ALT: 33
AST: 45 — ABNORMAL HIGH
Alkaline Phosphatase: 60
Calcium: 9.9
Creatinine, Ser: 0.9
GFR calc Af Amer: >60

## 2011-01-03 LAB — URINE MICROSCOPIC-ADD ON

## 2011-01-03 LAB — MONONUCLEOSIS SCREEN: Mono Screen: NEGATIVE

## 2011-01-03 LAB — CLOSTRIDIUM DIFFICILE EIA: C difficile Toxins A+B, EIA: NEGATIVE

## 2011-01-03 LAB — LIPASE, BLOOD: Lipase: 19

## 2011-01-11 ENCOUNTER — Ambulatory Visit
Admission: RE | Admit: 2011-01-11 | Discharge: 2011-01-11 | Disposition: A | Discharge status: Home / Self Care | Payer: BC Managed Care – PPO | Source: Ambulatory Visit | Attending: Neurosurgery | Admitting: Neurosurgery

## 2011-01-11 DIAGNOSIS — M542 Cervicalgia: Secondary | ICD-10-CM

## 2011-01-11 DIAGNOSIS — M5030 Other cervical disc degeneration, unspecified cervical region: Secondary | ICD-10-CM

## 2011-01-25 HISTORY — PX: CERVICAL SPINE SURGERY: SHX589

## 2011-03-01 ENCOUNTER — Other Ambulatory Visit: Payer: Self-pay | Admitting: Neurosurgery

## 2011-03-01 ENCOUNTER — Ambulatory Visit
Admission: RE | Admit: 2011-03-01 | Discharge: 2011-03-01 | Disposition: A | Discharge status: Home / Self Care | Payer: BC Managed Care – PPO | Source: Ambulatory Visit | Attending: Neurosurgery | Admitting: Neurosurgery

## 2011-03-01 DIAGNOSIS — M542 Cervicalgia: Secondary | ICD-10-CM

## 2011-04-12 ENCOUNTER — Ambulatory Visit
Admission: RE | Admit: 2011-04-12 | Discharge: 2011-04-12 | Disposition: A | Discharge status: Home / Self Care | Payer: BC Managed Care – PPO | Source: Ambulatory Visit | Attending: Neurosurgery | Admitting: Neurosurgery

## 2011-04-12 ENCOUNTER — Other Ambulatory Visit: Payer: Self-pay | Admitting: Neurosurgery

## 2011-04-12 DIAGNOSIS — M542 Cervicalgia: Secondary | ICD-10-CM

## 2011-06-12 ENCOUNTER — Ambulatory Visit
Admission: RE | Admit: 2011-06-12 | Discharge: 2011-06-12 | Disposition: A | Discharge status: Home / Self Care | Payer: BC Managed Care – PPO | Source: Ambulatory Visit | Attending: Neurosurgery | Admitting: Neurosurgery

## 2011-06-12 ENCOUNTER — Other Ambulatory Visit: Payer: Self-pay | Admitting: Neurosurgery

## 2011-06-12 DIAGNOSIS — M542 Cervicalgia: Secondary | ICD-10-CM

## 2012-04-29 LAB — HM MAMMOGRAPHY

## 2012-08-28 ENCOUNTER — Other Ambulatory Visit: Payer: Self-pay | Admitting: Neurosurgery

## 2012-08-28 DIAGNOSIS — M546 Pain in thoracic spine: Secondary | ICD-10-CM

## 2012-09-05 ENCOUNTER — Ambulatory Visit
Admission: RE | Admit: 2012-09-05 | Discharge: 2012-09-05 | Disposition: A | Discharge status: Home / Self Care | Payer: BC Managed Care – PPO | Source: Ambulatory Visit | Attending: Neurosurgery | Admitting: Neurosurgery

## 2012-09-05 DIAGNOSIS — M546 Pain in thoracic spine: Secondary | ICD-10-CM

## 2013-01-29 ENCOUNTER — Ambulatory Visit: Payer: Self-pay | Admitting: Nurse Practitioner

## 2013-01-29 ENCOUNTER — Encounter: Payer: Self-pay | Admitting: Nurse Practitioner

## 2013-02-02 ENCOUNTER — Other Ambulatory Visit: Payer: Self-pay | Admitting: Neurosurgery

## 2013-02-02 DIAGNOSIS — M5010 Cervical disc disorder with radiculopathy, unspecified cervical region: Secondary | ICD-10-CM

## 2013-02-10 ENCOUNTER — Ambulatory Visit
Admission: RE | Admit: 2013-02-10 | Discharge: 2013-02-10 | Disposition: A | Discharge status: Home / Self Care | Payer: BC Managed Care – PPO | Source: Ambulatory Visit | Attending: Neurosurgery | Admitting: Neurosurgery

## 2013-02-10 DIAGNOSIS — M5010 Cervical disc disorder with radiculopathy, unspecified cervical region: Secondary | ICD-10-CM

## 2013-05-15 ENCOUNTER — Encounter: Payer: Self-pay | Admitting: Nurse Practitioner

## 2013-05-18 ENCOUNTER — Encounter: Payer: Self-pay | Admitting: Nurse Practitioner

## 2013-05-18 ENCOUNTER — Ambulatory Visit (INDEPENDENT_AMBULATORY_CARE_PROVIDER_SITE_OTHER): Payer: BC Managed Care – PPO | Admitting: Nurse Practitioner

## 2013-05-18 VITALS — BP 126/84 | HR 84 | Ht 66.25 in | Wt 212.0 lb

## 2013-05-18 DIAGNOSIS — Z01419 Encounter for gynecological examination (general) (routine) without abnormal findings: Secondary | ICD-10-CM

## 2013-05-18 DIAGNOSIS — Z0000 Encounter for general adult medical examination without abnormal findings: Secondary | ICD-10-CM

## 2013-05-18 DIAGNOSIS — IMO0001 Reserved for inherently not codable concepts without codable children: Secondary | ICD-10-CM | POA: Insufficient documentation

## 2013-05-18 DIAGNOSIS — N816 Rectocele: Secondary | ICD-10-CM

## 2013-05-18 DIAGNOSIS — N8111 Cystocele, midline: Secondary | ICD-10-CM

## 2013-05-18 DIAGNOSIS — Z780 Asymptomatic menopausal state: Secondary | ICD-10-CM

## 2013-05-18 DIAGNOSIS — IMO0002 Reserved for concepts with insufficient information to code with codable children: Secondary | ICD-10-CM

## 2013-05-18 LAB — POCT URINALYSIS DIPSTICK
BILIRUBIN UA: NEGATIVE
GLUCOSE UA: NEGATIVE
Ketones, UA: NEGATIVE
LEUKOCYTES UA: NEGATIVE
NITRITE UA: NEGATIVE
Protein, UA: NEGATIVE
RBC UA: NEGATIVE
Urobilinogen, UA: NEGATIVE
pH, UA: 5

## 2013-05-18 LAB — HEMOGLOBIN, FINGERSTICK: HEMOGLOBIN, FINGERSTICK: 15.5 g/dL (ref 12.0–16.0)

## 2013-05-18 NOTE — Patient Instructions (Signed)

## 2013-05-18 NOTE — Progress Notes (Signed)
Patient ID: Lindsay Thompson, female   DOB: 1950-12-18, 63 y.o.   MRN: [id number] 63 y.o. G67P2002 Married Caucasian Fe here for annual exam.  No HRT. No vaso symptoms.she does have nocturia X 3 but is able to go back to sleep. States this is not a change for her.  Working 5 hours 5 days a week.  Helps with care of elderly parents.  No LMP recorded. Patient is postmenopausal.          Sexually active: yes  The current method of family planning is post menopausal status.    Exercising: yes  Home exercise routine includes walking and treadmill every other day.. Smoker:  no  Health Maintenance: Pap:  04/10/00, WNL (TVH) MMG:  04/29/12, Bi-Rads 2: benign findings done today Colonoscopy:  10/20/108, ischemic colitis, repeat in 5 years BMD:  12/08, 0.8/1.1/1.2 TDaP:  2011 Labs:  HB:  15.5 Urine:  Negative, pH 5.0   reports that she has never smoked. She has never used smokeless tobacco. She reports that she drinks alcohol. She reports that she does not use illicit drugs.  Past Medical History  Diagnosis Date  . Hypertension   . Bell's palsy 06/2002  . Ischemic colitis 10/08  . Cystocele, grade 2     high  . Rectocele     mod grade 2  . Hyperlipidemia     Past Surgical History  Procedure Laterality Date  . Tonsillectomy and adenoidectomy  1972  . Appendectomy  1974  . Laminectomy  12/95    L5-S1  . Cholecystectomy, laparoscopic  2/93  . Back surgery  4/96  . Laparoscopic salpingo oopherectomy Right 1/98    Dickstein, endometriosis  . Laparoscopic abdominal exploration  11/00    LLQ pain, Dickstein  . Cervical fusion  1998    C5-C6  . Breast reduction surgery Bilateral 9/02  . Neck surgery  10/03    plate in neck for herniated disc, 3/05 changed plate  . Cervical spine surgery  01/25/11    cervical neck cage C2-4/5  . Total vaginal hysterectomy  1990    pelvic relaxation, and endometriosis    Current Outpatient Prescriptions  Medication Sig Dispense Refill  . atorvastatin  (LIPITOR) 10 MG tablet Take 10 mg by mouth daily.      . Multiple Vitamin (MULTIVITAMIN) tablet Take 1 tablet by mouth daily.      . Omega-3 Fatty Acids (FISH OIL) 1000 MG CAPS Take by mouth.      . telmisartan (MICARDIS) 80 MG tablet Take 80 mg by mouth daily.       No current facility-administered medications for this visit.    Family History  Problem Relation Age of Onset  . Colon polyps Father   . Nephrolithiasis Father   . Prostate cancer Father   . Hyperlipidemia Father   . Hypertension Father   . Osteoporosis Mother 78  . Heart disease Mother   . Hypertension Mother   . Hyperlipidemia Mother   . Hyperlipidemia Brother   . Hypertension Brother   . Prostate cancer Brother   . Stroke Maternal Aunt   . Heart failure Maternal Uncle   . Pneumonia Maternal Grandmother   . Heart failure Maternal Grandfather   . Cancer Paternal Grandfather   . Hyperlipidemia Brother   . Hypertension Brother     ROS:  Pertinent items are noted in HPI.  Otherwise, a comprehensive ROS was negative.  Exam:   BP 126/84  Pulse 84  Ht 5'  6.25" (1.683 m)  Wt 212 lb (96.163 kg)  BMI 33.95 kg/m2 Height: 5' 6.25" (168.3 cm)  Ht Readings from Last 3 Encounters:  05/18/13 5' 6.25" (1.683 m)    General appearance: alert, cooperative and appears stated age Head: Normocephalic, without obvious abnormality, atraumatic Neck: no adenopathy, supple, symmetrical, trachea midline and thyroid normal to inspection and palpation Lungs: clear to auscultation bilaterally Breasts: normal appearance, no masses or tenderness, surgical breast reduction scars Heart: regular rate and rhythm Abdomen: soft, non-tender; no masses,  no organomegaly Extremities: extremities normal, atraumatic, no cyanosis or edema Skin: Skin color, texture, turgor normal. No rashes or lesions Lymph nodes: Cervical, supraclavicular, and axillary nodes normal. No abnormal inguinal nodes palpated Neurologic: Grossly normal   Pelvic:  External genitalia:  no lesions              Urethra:  normal appearing urethra with no masses, tenderness or lesions              Bartholin's and Skene's: normal                 Vagina: normal appearing vagina with normal color and discharge, no lesions grade II cystocele and rectocele              Cervix: absent              Pap taken: no Bimanual Exam:  Uterus:  uterus absent              Adnexa: no mass, fullness, tenderness               Rectovaginal: Confirms               Anus:  normal sphincter tone, no lesions  A:  Well Woman with normal exam  S/P TVH for endometriosis and pelvic relaxation 1990  S/P RSO 1/98  Grade II Cystocele and Rectocele - prior eval with Dr. Sherron MondayMacDiarmid  P:   Pap smear as per guidelines   Mammogram today and repeat in 1 year  Order for BMD in Epic and faxed  Counseled on breast self exam, mammography screening, adequate intake of calcium and vitamin D, diet and exercise, Kegel's exercises return annually or prn  An After Visit Summary was printed and given to the patient.

## 2013-05-21 NOTE — Progress Notes (Signed)
Encounter reviewed by Dr. Brook Silva.  

## 2013-10-22 ENCOUNTER — Ambulatory Visit
Admission: RE | Admit: 2013-10-22 | Discharge: 2013-10-22 | Disposition: A | Discharge status: Home / Self Care | Payer: BC Managed Care – PPO | Source: Ambulatory Visit | Attending: Physician Assistant | Admitting: Physician Assistant

## 2013-10-22 ENCOUNTER — Other Ambulatory Visit: Payer: Self-pay | Admitting: Physician Assistant

## 2013-10-22 DIAGNOSIS — M25561 Pain in right knee: Secondary | ICD-10-CM

## 2013-10-22 DIAGNOSIS — M25562 Pain in left knee: Principal | ICD-10-CM

## 2014-01-25 ENCOUNTER — Encounter: Payer: Self-pay | Admitting: Nurse Practitioner

## 2014-05-20 ENCOUNTER — Encounter: Payer: Self-pay | Admitting: Nurse Practitioner

## 2014-05-20 ENCOUNTER — Ambulatory Visit (INDEPENDENT_AMBULATORY_CARE_PROVIDER_SITE_OTHER): Payer: BLUE CROSS/BLUE SHIELD | Admitting: Nurse Practitioner

## 2014-05-20 VITALS — BP 144/96 | HR 96 | Resp 16 | Ht 66.25 in | Wt 211.0 lb

## 2014-05-20 DIAGNOSIS — Z01419 Encounter for gynecological examination (general) (routine) without abnormal findings: Secondary | ICD-10-CM

## 2014-05-20 DIAGNOSIS — Z0000 Encounter for general adult medical examination without abnormal findings: Secondary | ICD-10-CM

## 2014-05-20 DIAGNOSIS — E2839 Other primary ovarian failure: Secondary | ICD-10-CM

## 2014-05-20 LAB — POCT URINALYSIS DIPSTICK
BILIRUBIN UA: NEGATIVE
Blood, UA: NEGATIVE
GLUCOSE UA: NEGATIVE
KETONES UA: NEGATIVE
Leukocytes, UA: NEGATIVE
Nitrite, UA: NEGATIVE
PH UA: 6
Protein, UA: NEGATIVE
Urobilinogen, UA: NEGATIVE

## 2014-05-20 NOTE — Patient Instructions (Signed)

## 2014-05-20 NOTE — Progress Notes (Signed)
Patient ID: Lindsay Thompson, female   DOB: 11/24/1950, 64 y.o.   MRN: [id number] 64 y.o. 42P2002 Married  Caucasian Fe here for annual exam.    Patient's last menstrual period was 03/26/1988.          Sexually active: Yes.    The current method of family planning is status post hysterectomy.    Exercising: Yes.    treadmill Smoker:  no  Health Maintenance: Pap:  04/10/00, negative (TVH) MMG:  05/18/13, Bi-Rads 2:  benign Colonoscopy:  01/13/07, ischemic colitis, repeat in 10 years per patient Endoscopy:  11/15 no ulcers, gastritis BMD:   12/08, 0.8/1.1/1.2 TDaP:  2011 Labs:  Dr. Loreta AveMann  Urine:  Negative    reports that she has never smoked. She has never used smokeless tobacco. She reports that she drinks alcohol. She reports that she does not use illicit drugs.  Past Medical History  Diagnosis Date  . Hypertension   . Bell's palsy 06/2002  . Ischemic colitis 10/08  . Cystocele, grade 2     high  . Rectocele     mod grade 2  . Hyperlipidemia     Past Surgical History  Procedure Laterality Date  . Tonsillectomy and adenoidectomy  1972  . Appendectomy  1974  . Laminectomy  12/95    L5-S1  . Cholecystectomy, laparoscopic  2/93  . Back surgery  4/96  . Laparoscopic salpingo oopherectomy Right 1/98    Dickstein, endometriosis  . Laparoscopic abdominal exploration  11/00    LLQ pain, Dickstein  . Cervical fusion  1998    C5-C6  . Breast reduction surgery Bilateral 9/02  . Neck surgery  10/03    plate in neck for herniated disc, 3/05 changed plate  . Cervical spine surgery  01/25/11    cervical neck cage C2-4/5  . Total vaginal hysterectomy  1990    pelvic relaxation, and endometriosis    Current Outpatient Prescriptions  Medication Sig Dispense Refill  . atorvastatin (LIPITOR) 10 MG tablet Take 10 mg by mouth daily.    . Multiple Vitamin (MULTIVITAMIN) tablet Take 1 tablet by mouth daily.    . Omega-3 Fatty Acids (FISH OIL) 1000 MG CAPS Take by mouth.    . telmisartan  (MICARDIS) 80 MG tablet Take 80 mg by mouth daily.     No current facility-administered medications for this visit.    Family History  Problem Relation Age of Onset  . Colon polyps Father   . Nephrolithiasis Father   . Prostate cancer Father   . Hyperlipidemia Father   . Hypertension Father   . Bladder Cancer Father [id number]  . Osteoporosis Mother [id number]  . Heart disease Mother   . Hypertension Mother   . Hyperlipidemia Mother   . Hyperlipidemia Brother   . Hypertension Brother   . Prostate cancer Brother   . Stroke Maternal Aunt   . Heart failure Maternal Uncle   . Pneumonia Maternal Grandmother   . Heart failure Maternal Grandfather   . Cancer Paternal Grandfather   . Hyperlipidemia Brother   . Hypertension Brother   . Prostate cancer Brother 67    ROS:  Pertinent items are noted in HPI.  Otherwise, a comprehensive ROS was negative.  Exam:   BP 144/96 mmHg  Pulse 96  Resp 16  Ht 5' 6.25" (1.683 m)  Wt 211 lb (95.709 kg)  BMI 33.79 kg/m2  LMP 03/26/1988 Height: 5' 6.25" (168.3 cm) Ht Readings from Last 3 Encounters:  05/20/14 5'  6.25" (1.683 m)  05/18/13 5' 6.25" (1.683 m)    General appearance: alert, cooperative and appears stated age Head: Normocephalic, without obvious abnormality, atraumatic Neck: no adenopathy, supple, symmetrical, trachea midline and thyroid normal to inspection and palpation Lungs: clear to auscultation bilaterally Breasts: normal appearance, no masses or tenderness Heart: regular rate and rhythm Abdomen: soft, non-tender; no masses,  no organomegaly Extremities: extremities normal, atraumatic, no cyanosis or edema Skin: Skin color, texture, turgor normal. No rashes or lesions Lymph nodes: Cervical, supraclavicular, and axillary nodes normal. No abnormal inguinal nodes palpated Neurologic: Grossly normal   Pelvic: External genitalia:  no lesions              Urethra:  normal appearing urethra with no masses, tenderness or lesions               Bartholin's and Skene's: normal                 Vagina: normal appearing vagina with normal color and discharge, no lesions              Cervix: absent              Pap taken: No. Bimanual Exam:  Uterus:  uterus absent              Adnexa: no mass, fullness, tenderness               Rectovaginal: Confirms               Anus:  normal sphincter tone, no lesions  Chaperone present:  no  A:  Well Woman with normal exam  S/P TVH for endometriosis and pelvic relaxation 1990 S/P RSO 1/98 Grade II Cystocele and Rectocele - prior eval with Dr. Sherron Monday   P:   Reviewed health and wellness pertinent to exam  Pap smear not taken today  Mammogram is due 2/16  Will get a repeat of BMD order faxed to [facility] on breast self exam, mammography screening, adequate intake of calcium and vitamin D, diet and exercise return annually or prn  An After Visit Summary was printed and given to the patient.

## 2014-05-22 NOTE — Progress Notes (Signed)
Encounter reviewed by Dr. Cayton Cuevas Silva.  

## 2014-06-02 ENCOUNTER — Other Ambulatory Visit: Payer: Self-pay | Admitting: Nephrology

## 2014-06-03 ENCOUNTER — Ambulatory Visit
Admission: RE | Admit: 2014-06-03 | Discharge: 2014-06-03 | Disposition: A | Discharge status: Home / Self Care | Payer: BLUE CROSS/BLUE SHIELD | Source: Ambulatory Visit | Attending: Nephrology | Admitting: Nephrology

## 2014-06-03 ENCOUNTER — Other Ambulatory Visit: Payer: Self-pay | Admitting: Nephrology

## 2014-06-03 DIAGNOSIS — M25522 Pain in left elbow: Secondary | ICD-10-CM

## 2014-06-03 DIAGNOSIS — M25512 Pain in left shoulder: Secondary | ICD-10-CM

## 2015-01-11 ENCOUNTER — Telehealth: Payer: Self-pay | Admitting: Nurse Practitioner

## 2015-01-11 DIAGNOSIS — Z13820 Encounter for screening for osteoporosis: Secondary | ICD-10-CM

## 2015-01-11 NOTE — Telephone Encounter (Signed)
Spoke with patient and her husband. Advised that [facility] will be sent. Advised can order as a screening for bone density,however, I cannot guarantee how procedure will be covered or not. Encouraged both husband and patient to discuss further with [facility] if any questions about coverage as our office cannot advise how order will be potentially "coded."  Patient [facility] scheduled for 01/31/15 at 1245 and would like to add on to appointment.  Last bone density completed at [facility] 03/24/2007.   [facility]. Routing to provider for final review. Patient agreeable to disposition. Will close encounter.

## 2015-01-11 NOTE — Telephone Encounter (Signed)
Patient is calling to make sure the Bone Density order is coded as preventive otherwise she will have to pay out of pocket.

## 2015-02-07 ENCOUNTER — Telehealth: Payer: Self-pay | Admitting: Nurse Practitioner

## 2015-02-07 NOTE — Telephone Encounter (Signed)
Please let pt. Know that BMD done on 02/02/15 shows a T Score at the spine of -0.60; left hip neck at -0.40; right hip neck at +0.30.  This all falls into the normal BMD range. However comparison to 03/24/2007 there is decrease at spine -14.3%; left hip neck at -8.5% and right hip neck at -0.6%.  Good job on exercise, but must continue with upper body weight bearing and walking.  Continue with calcium and Vit D support.  Repeat BMD in 2-3 years. Continue to be active.

## 2015-02-09 NOTE — Telephone Encounter (Signed)
Pt notified of results.  She is excited about results.  She will continue with recommendations to prevent further bone loss. Closing encounter.

## 2015-05-26 ENCOUNTER — Ambulatory Visit: Payer: BLUE CROSS/BLUE SHIELD | Admitting: Nurse Practitioner

## 2015-06-08 ENCOUNTER — Ambulatory Visit: Payer: BLUE CROSS/BLUE SHIELD | Admitting: Nurse Practitioner

## 2015-08-23 ENCOUNTER — Other Ambulatory Visit: Payer: Self-pay | Admitting: Family Medicine

## 2015-08-23 ENCOUNTER — Ambulatory Visit
Admission: RE | Admit: 2015-08-23 | Discharge: 2015-08-23 | Disposition: A | Discharge status: Home / Self Care | Payer: BLUE CROSS/BLUE SHIELD | Source: Ambulatory Visit | Attending: Family Medicine | Admitting: Family Medicine

## 2015-08-23 DIAGNOSIS — M25562 Pain in left knee: Secondary | ICD-10-CM

## 2015-09-02 ENCOUNTER — Other Ambulatory Visit: Payer: Self-pay | Admitting: Family Medicine

## 2015-09-02 DIAGNOSIS — M25562 Pain in left knee: Secondary | ICD-10-CM

## 2015-09-07 ENCOUNTER — Ambulatory Visit
Admission: RE | Admit: 2015-09-07 | Discharge: 2015-09-07 | Disposition: A | Discharge status: Home / Self Care | Payer: BLUE CROSS/BLUE SHIELD | Source: Ambulatory Visit | Attending: Family Medicine | Admitting: Family Medicine

## 2015-09-07 DIAGNOSIS — M25562 Pain in left knee: Secondary | ICD-10-CM

## 2015-09-13 ENCOUNTER — Ambulatory Visit: Payer: BLUE CROSS/BLUE SHIELD | Admitting: Nurse Practitioner

## 2015-09-22 DIAGNOSIS — M25562 Pain in left knee: Secondary | ICD-10-CM | POA: Diagnosis not present

## 2015-09-22 DIAGNOSIS — M1712 Unilateral primary osteoarthritis, left knee: Secondary | ICD-10-CM | POA: Diagnosis not present

## 2015-10-05 DIAGNOSIS — H9311 Tinnitus, right ear: Secondary | ICD-10-CM | POA: Diagnosis not present

## 2015-10-05 DIAGNOSIS — H8141 Vertigo of central origin, right ear: Secondary | ICD-10-CM | POA: Diagnosis not present

## 2015-10-05 DIAGNOSIS — J320 Chronic maxillary sinusitis: Secondary | ICD-10-CM | POA: Diagnosis not present

## 2015-10-05 DIAGNOSIS — H9041 Sensorineural hearing loss, unilateral, right ear, with unrestricted hearing on the contralateral side: Secondary | ICD-10-CM | POA: Diagnosis not present

## 2015-10-12 DIAGNOSIS — H9311 Tinnitus, right ear: Secondary | ICD-10-CM | POA: Diagnosis not present

## 2015-10-12 DIAGNOSIS — H9041 Sensorineural hearing loss, unilateral, right ear, with unrestricted hearing on the contralateral side: Secondary | ICD-10-CM | POA: Diagnosis not present

## 2015-10-25 DIAGNOSIS — H903 Sensorineural hearing loss, bilateral: Secondary | ICD-10-CM | POA: Insufficient documentation

## 2015-10-31 DIAGNOSIS — H903 Sensorineural hearing loss, bilateral: Secondary | ICD-10-CM | POA: Diagnosis not present

## 2015-11-03 DIAGNOSIS — M23222 Derangement of posterior horn of medial meniscus due to old tear or injury, left knee: Secondary | ICD-10-CM | POA: Diagnosis not present

## 2015-11-03 DIAGNOSIS — G8918 Other acute postprocedural pain: Secondary | ICD-10-CM | POA: Diagnosis not present

## 2015-11-03 DIAGNOSIS — M94262 Chondromalacia, left knee: Secondary | ICD-10-CM | POA: Diagnosis not present

## 2015-11-03 DIAGNOSIS — M23342 Other meniscus derangements, anterior horn of lateral meniscus, left knee: Secondary | ICD-10-CM | POA: Diagnosis not present

## 2015-11-03 DIAGNOSIS — M23242 Derangement of anterior horn of lateral meniscus due to old tear or injury, left knee: Secondary | ICD-10-CM | POA: Diagnosis not present

## 2015-11-03 DIAGNOSIS — M23322 Other meniscus derangements, posterior horn of medial meniscus, left knee: Secondary | ICD-10-CM | POA: Diagnosis not present

## 2015-11-03 HISTORY — PX: MENISCUS REPAIR: SHX5179

## 2015-11-08 DIAGNOSIS — H903 Sensorineural hearing loss, bilateral: Secondary | ICD-10-CM | POA: Diagnosis not present

## 2015-11-08 DIAGNOSIS — H9311 Tinnitus, right ear: Secondary | ICD-10-CM | POA: Diagnosis not present

## 2015-11-09 DIAGNOSIS — M530 Cervicocranial syndrome: Secondary | ICD-10-CM | POA: Diagnosis not present

## 2015-11-09 DIAGNOSIS — M9901 Segmental and somatic dysfunction of cervical region: Secondary | ICD-10-CM | POA: Diagnosis not present

## 2015-11-14 DIAGNOSIS — M530 Cervicocranial syndrome: Secondary | ICD-10-CM | POA: Diagnosis not present

## 2015-11-14 DIAGNOSIS — M9901 Segmental and somatic dysfunction of cervical region: Secondary | ICD-10-CM | POA: Diagnosis not present

## 2015-11-15 DIAGNOSIS — M530 Cervicocranial syndrome: Secondary | ICD-10-CM | POA: Diagnosis not present

## 2015-11-15 DIAGNOSIS — M9901 Segmental and somatic dysfunction of cervical region: Secondary | ICD-10-CM | POA: Diagnosis not present

## 2015-11-16 DIAGNOSIS — M530 Cervicocranial syndrome: Secondary | ICD-10-CM | POA: Diagnosis not present

## 2015-11-16 DIAGNOSIS — M9901 Segmental and somatic dysfunction of cervical region: Secondary | ICD-10-CM | POA: Diagnosis not present

## 2015-11-21 DIAGNOSIS — M530 Cervicocranial syndrome: Secondary | ICD-10-CM | POA: Diagnosis not present

## 2015-11-21 DIAGNOSIS — M9901 Segmental and somatic dysfunction of cervical region: Secondary | ICD-10-CM | POA: Diagnosis not present

## 2015-11-23 DIAGNOSIS — M530 Cervicocranial syndrome: Secondary | ICD-10-CM | POA: Diagnosis not present

## 2015-11-23 DIAGNOSIS — M9901 Segmental and somatic dysfunction of cervical region: Secondary | ICD-10-CM | POA: Diagnosis not present

## 2015-11-29 DIAGNOSIS — M9901 Segmental and somatic dysfunction of cervical region: Secondary | ICD-10-CM | POA: Diagnosis not present

## 2015-11-29 DIAGNOSIS — M530 Cervicocranial syndrome: Secondary | ICD-10-CM | POA: Diagnosis not present

## 2015-11-30 DIAGNOSIS — M25562 Pain in left knee: Secondary | ICD-10-CM | POA: Diagnosis not present

## 2015-12-06 DIAGNOSIS — S161XXA Strain of muscle, fascia and tendon at neck level, initial encounter: Secondary | ICD-10-CM | POA: Diagnosis not present

## 2015-12-13 DIAGNOSIS — L821 Other seborrheic keratosis: Secondary | ICD-10-CM | POA: Diagnosis not present

## 2015-12-13 DIAGNOSIS — L918 Other hypertrophic disorders of the skin: Secondary | ICD-10-CM | POA: Diagnosis not present

## 2015-12-13 DIAGNOSIS — D225 Melanocytic nevi of trunk: Secondary | ICD-10-CM | POA: Diagnosis not present

## 2016-02-07 DIAGNOSIS — M1712 Unilateral primary osteoarthritis, left knee: Secondary | ICD-10-CM | POA: Diagnosis not present

## 2016-02-08 DIAGNOSIS — Z1231 Encounter for screening mammogram for malignant neoplasm of breast: Secondary | ICD-10-CM | POA: Diagnosis not present

## 2016-02-20 DIAGNOSIS — H9041 Sensorineural hearing loss, unilateral, right ear, with unrestricted hearing on the contralateral side: Secondary | ICD-10-CM | POA: Diagnosis not present

## 2016-02-20 DIAGNOSIS — H9311 Tinnitus, right ear: Secondary | ICD-10-CM | POA: Diagnosis not present

## 2016-02-22 ENCOUNTER — Other Ambulatory Visit: Payer: Self-pay | Admitting: Otolaryngology

## 2016-02-22 DIAGNOSIS — IMO0001 Reserved for inherently not codable concepts without codable children: Secondary | ICD-10-CM

## 2016-02-22 DIAGNOSIS — H9311 Tinnitus, right ear: Secondary | ICD-10-CM

## 2016-02-22 DIAGNOSIS — H9041 Sensorineural hearing loss, unilateral, right ear, with unrestricted hearing on the contralateral side: Secondary | ICD-10-CM

## 2016-02-24 ENCOUNTER — Ambulatory Visit
Admission: RE | Admit: 2016-02-24 | Discharge: 2016-02-24 | Disposition: A | Discharge status: Home / Self Care | Payer: BLUE CROSS/BLUE SHIELD | Source: Ambulatory Visit | Attending: Otolaryngology | Admitting: Otolaryngology

## 2016-02-24 DIAGNOSIS — IMO0001 Reserved for inherently not codable concepts without codable children: Secondary | ICD-10-CM

## 2016-02-24 DIAGNOSIS — H9311 Tinnitus, right ear: Secondary | ICD-10-CM | POA: Diagnosis not present

## 2016-02-24 DIAGNOSIS — H9041 Sensorineural hearing loss, unilateral, right ear, with unrestricted hearing on the contralateral side: Secondary | ICD-10-CM

## 2016-02-24 MED ORDER — GADOBENATE DIMEGLUMINE 529 MG/ML IV SOLN
20.0000 mL | Freq: Once | INTRAVENOUS | Status: AC | PRN
  Administered 2016-02-24: 20 mL via INTRAVENOUS

## 2016-03-21 ENCOUNTER — Ambulatory Visit (INDEPENDENT_AMBULATORY_CARE_PROVIDER_SITE_OTHER): Payer: BLUE CROSS/BLUE SHIELD | Admitting: Nurse Practitioner

## 2016-03-21 ENCOUNTER — Encounter: Payer: Self-pay | Admitting: Nurse Practitioner

## 2016-03-21 VITALS — BP 130/76 | HR 64 | Ht 66.5 in | Wt 204.0 lb

## 2016-03-21 DIAGNOSIS — Z01419 Encounter for gynecological examination (general) (routine) without abnormal findings: Secondary | ICD-10-CM

## 2016-03-21 DIAGNOSIS — Z0000 Encounter for general adult medical examination without abnormal findings: Secondary | ICD-10-CM

## 2016-03-21 LAB — HEPATITIS C ANTIBODY: HCV Ab: NEGATIVE

## 2016-03-21 MED ORDER — FLUCONAZOLE 150 MG PO TABS: 150.0000 mg | ORAL_TABLET | Freq: Once | ORAL | 0 refills | Status: AC

## 2016-03-21 NOTE — Patient Instructions (Addendum)

## 2016-03-21 NOTE — Addendum Note (Signed)
Addended by: Luisa DagoPHILLIPS, STEPHANIE C on: 03/21/2016 09:13 AM   Modules accepted: Orders

## 2016-03-21 NOTE — Progress Notes (Signed)
Patient ID: Lindsay Thompson, female   DOB: 05/22/1950, 65 y.o.   MRN: [id number]  65 y.o. G2P2002 Married  Caucasian Fe here for annual exam.  Left knee scope with repair 10/2015. Also diagnosed with Meniere's disease with hearing loss 09/17/15.  No problems with urine leakage.  Current antibiotics for sinusitis.  Patient's last menstrual period was 03/26/1988.          Sexually active: Yes.    The current method of family planning is status post hysterectomy.    Exercising: Yes.    Home exercise routine includes exercise bike. Smoker:  no  Health Maintenance: Pap:  04/10/00, WNL (TVH) MMG: 02/08/16, Bi-Rads 2: benign findings Colonoscopy:  01/13/07, ischemic colitis, repeat in 10 years Endoscopy:  11/15 no ulcers, gastritis BMD: 02/02/2015: -0.60 Spine / +0.30 Right Femur Neck / -0.40 Left Femur Neck TDaP:  2011 Shingles: Never Pneumonia: Not indicated due to age Hep C and HIV: drawn today Labs: PCP takes care of all labs   reports that she has never smoked. She has never used smokeless tobacco. She reports that she drinks alcohol. She reports that she does not use drugs.  Past Medical History:  Diagnosis Date  . Bell's palsy 06/2002  . Cystocele, grade 2    high  . Hyperlipidemia   . Hypertension   . Ischemic colitis (HCC) 10/08  . Rectocele    mod grade 2    Past Surgical History:  Procedure Laterality Date  . APPENDECTOMY  1974  . BACK SURGERY  4/96  . BREAST REDUCTION SURGERY Bilateral 9/02  . CERVICAL FUSION  1998   C5-C6  . CERVICAL SPINE SURGERY  01/25/11   cervical neck cage C2-4/5  . CHOLECYSTECTOMY, LAPAROSCOPIC  2/93  . LAMINECTOMY  12/95   L5-S1  . LAPAROSCOPIC ABDOMINAL EXPLORATION  11/00   LLQ pain, Dickstein  . LAPAROSCOPIC SALPINGO OOPHERECTOMY Right 1/98   Dickstein, endometriosis  . NECK SURGERY  10/03   plate in neck for herniated disc, 3/05 changed plate  . TONSILLECTOMY AND ADENOIDECTOMY  1972  . TOTAL VAGINAL HYSTERECTOMY  1990   pelvic relaxation,  and endometriosis    Current Outpatient Prescriptions  Medication Sig Dispense Refill  . atorvastatin (LIPITOR) 10 MG tablet Take 10 mg by mouth daily.    . Multiple Vitamin (MULTIVITAMIN) tablet Take 1 tablet by mouth daily.    . Omega-3 Fatty Acids (FISH OIL) 1000 MG CAPS Take by mouth.    . telmisartan (MICARDIS) 80 MG tablet Take 80 mg by mouth daily.     No current facility-administered medications for this visit.     Family History  Problem Relation Age of Onset  . Colon polyps Father   . Nephrolithiasis Father   . Prostate cancer Father   . Hyperlipidemia Father   . Hypertension Father   . Bladder Cancer Father [id number]  . Osteoporosis Mother [id number]  . Heart disease Mother   . Hypertension Mother   . Hyperlipidemia Mother   . Hyperlipidemia Brother   . Hypertension Brother   . Prostate cancer Brother   . Stroke Maternal Aunt   . Heart failure Maternal Uncle   . Pneumonia Maternal Grandmother   . Heart failure Maternal Grandfather   . Cancer Paternal Grandfather   . Hyperlipidemia Brother   . Hypertension Brother   . Prostate cancer Brother 67    ROS:  Pertinent items are noted in HPI.  Otherwise, a comprehensive ROS was negative.  Exam:   LMP  03/26/1988    Ht Readings from Last 3 Encounters:  05/20/14 5' 6.25" (1.683 m)  05/18/13 5' 6.25" (1.683 m)    General appearance: alert, cooperative and appears stated age Head: Normocephalic, without obvious abnormality, atraumatic Neck: no adenopathy, supple, symmetrical, trachea midline and thyroid normal to inspection and palpation Lungs: clear to auscultation bilaterally Breasts: normal appearance, no masses or tenderness Heart: regular rate and rhythm Abdomen: soft, non-tender; no masses,  no organomegaly Extremities: extremities normal, atraumatic, no cyanosis or edema Skin: Skin color, texture, turgor normal. No rashes or lesions Lymph nodes: Cervical, supraclavicular, and axillary nodes normal. No abnormal inguinal  nodes palpated Neurologic: Grossly normal   Pelvic: External genitalia:  no lesions              Urethra:  normal appearing urethra with no masses, tenderness or lesions              Bartholin's and Skene's: normal                 Vagina: normal appearing vagina with normal color and discharge, no lesions              Cervix: absent              Pap taken: No. Bimanual Exam:  Uterus:  uterus absent              Adnexa: no mass, fullness, tenderness               Rectovaginal: Confirms               Anus:  normal sphincter tone, no lesions  Chaperone present: yes  A:  Well Woman with normal exam         S/P TVH for endometriosis and pelvic relaxation 1990 S/P RSO 1/98 Grade II Cystocele and Rectocele - prior eval with Dr. Sherron MondayMacDiarmid   P:   Reviewed health and wellness pertinent to exam  Pap smear as above  Mammogram is due 11/18  Follow with labs  Rx for Diflucan since she is on antibiotics for sinusitis  Counseled on breast self exam, mammography screening, adequate intake of calcium and vitamin D, diet and exercise, Kegel's exercises return annually or prn  An After Visit Summary was printed and given to the patient.

## 2016-03-22 LAB — HIV ANTIBODY (ROUTINE TESTING W REFLEX): HIV: NONREACTIVE

## 2016-03-24 NOTE — Progress Notes (Signed)
Encounter reviewed by Dr. Tamarius Rosenfield Amundson C. Silva.  

## 2016-04-09 DIAGNOSIS — M25561 Pain in right knee: Secondary | ICD-10-CM | POA: Diagnosis not present

## 2016-04-25 ENCOUNTER — Encounter: Payer: Self-pay | Admitting: Nurse Practitioner

## 2016-04-25 ENCOUNTER — Ambulatory Visit (INDEPENDENT_AMBULATORY_CARE_PROVIDER_SITE_OTHER): Payer: BLUE CROSS/BLUE SHIELD | Admitting: Nurse Practitioner

## 2016-04-25 VITALS — BP 126/84 | HR 72 | Ht 66.5 in | Wt 204.0 lb

## 2016-04-25 DIAGNOSIS — N760 Acute vaginitis: Secondary | ICD-10-CM | POA: Diagnosis not present

## 2016-04-25 DIAGNOSIS — R300 Dysuria: Secondary | ICD-10-CM

## 2016-04-25 DIAGNOSIS — R350 Frequency of micturition: Secondary | ICD-10-CM | POA: Diagnosis not present

## 2016-04-25 LAB — POCT URINALYSIS DIPSTICK
Bilirubin, UA: NEGATIVE
Glucose, UA: NEGATIVE
KETONES UA: NEGATIVE
Nitrite, UA: NEGATIVE
PH UA: 5
UROBILINOGEN UA: NEGATIVE

## 2016-04-25 MED ORDER — NITROFURANTOIN MONOHYD MACRO 100 MG PO CAPS: 100.0000 mg | ORAL_CAPSULE | Freq: Two times a day (BID) | ORAL | 0 refills | Status: DC

## 2016-04-25 NOTE — Progress Notes (Signed)
Patient ID: Lindsay NeasMary R Thompson, female   DOB: 03/30/1950, [age] y.o.   MRN: [id number]  66 y.o. Married Caucasian female G2P2002 here with complaint of vaginal symptoms of itching, burning, and increase discharge. Describes discharge as minimal and thin white to clear.  Onset of symptoms 3-4 days ago. She did use Monistat Sunday, Monday and Tuesday with a lot of vaginal burning.  Denies new personal products except for a scented panty liner before these symptoms occurred.  She does have vaginal dryness.   No STD concerns. Urinary  symptoms urgency, frequency and dysuria.    O:  Healthy female WDWN Affect: normal, orientation x 3 Exam: no distress Abdomen: no pain suprapubic Lymph node: no enlargement or tenderness Pelvic exam: External genital: normal female BUS: negative Vagina: thin clear discharge noted.  Affirm taken.  UA: large Leuk's, small RBC, cloudy  A: R/O Vaginitis  R/O UTI vs. Urethritis  P: Discussed findings of vaginitis and etiology. Discussed Aveeno or baking soda sitz bath for comfort.  Increase water.    Rx: will start her on Macrobid until C&S is final  Follow with Affirm  Urine C&S and Micro  RV prn

## 2016-04-25 NOTE — Patient Instructions (Signed)
Will call with test results

## 2016-04-25 NOTE — Progress Notes (Signed)
Reviewed personally.  M. Suzanne Ashantia Amaral, MD.  

## 2016-04-26 LAB — URINE CULTURE

## 2016-04-26 LAB — URINALYSIS, MICROSCOPIC ONLY
CASTS: NONE SEEN [LPF]
Crystals: NONE SEEN [HPF]
WBC, UA: >60 WBC/HPF — AB (ref <=5)
YEAST: NONE SEEN [HPF]

## 2016-04-26 LAB — WET PREP BY MOLECULAR PROBE
Candida species: NEGATIVE
GARDNERELLA VAGINALIS: NEGATIVE
TRICHOMONAS VAG: NEGATIVE

## 2016-05-04 ENCOUNTER — Ambulatory Visit (INDEPENDENT_AMBULATORY_CARE_PROVIDER_SITE_OTHER): Payer: BLUE CROSS/BLUE SHIELD | Admitting: Nurse Practitioner

## 2016-05-04 ENCOUNTER — Telehealth: Payer: Self-pay | Admitting: Nurse Practitioner

## 2016-05-04 ENCOUNTER — Encounter: Payer: Self-pay | Admitting: Nurse Practitioner

## 2016-05-04 VITALS — BP 140/82 | HR 82 | Resp 20 | Ht 66.5 in | Wt 208.0 lb

## 2016-05-04 DIAGNOSIS — N952 Postmenopausal atrophic vaginitis: Secondary | ICD-10-CM

## 2016-05-04 DIAGNOSIS — N8189 Other female genital prolapse: Secondary | ICD-10-CM | POA: Diagnosis not present

## 2016-05-04 DIAGNOSIS — R300 Dysuria: Secondary | ICD-10-CM | POA: Diagnosis not present

## 2016-05-04 LAB — POCT URINALYSIS DIPSTICK
Bilirubin, UA: NEGATIVE
Glucose, UA: NEGATIVE
Ketones, UA: NEGATIVE
Nitrite, UA: NEGATIVE
PH UA: 6
PROTEIN UA: NEGATIVE
Urobilinogen, UA: NEGATIVE

## 2016-05-04 MED ORDER — CIPROFLOXACIN HCL 500 MG PO TABS: 500.0000 mg | ORAL_TABLET | Freq: Two times a day (BID) | ORAL | 0 refills | Status: DC

## 2016-05-04 MED ORDER — PHENAZOPYRIDINE HCL 200 MG PO TABS: 200.0000 mg | ORAL_TABLET | Freq: Three times a day (TID) | ORAL | 0 refills | Status: DC | PRN

## 2016-05-04 MED ORDER — ESTROGENS, CONJUGATED 0.625 MG/GM VA CREA: TOPICAL_CREAM | VAGINAL | 3 refills | Status: DC

## 2016-05-04 MED ORDER — FLUCONAZOLE 150 MG PO TABS: 150.0000 mg | ORAL_TABLET | Freq: Once | ORAL | 0 refills | Status: AC

## 2016-05-04 NOTE — Telephone Encounter (Signed)
Patient was seen in the office on 04/25/2016 for urinary frequency, dysuria, and vaginitis. Patient was started on Macrobid. Urine culture and affirm returned negative. MyChart message sent by Ria CommentPatricia Grubb, FNP advising patient to stop Macrobid and return call if symptoms persist. Spoke with patient who states that she completed full course of Macrobid (did not see mychart). Results reviewed with patient. Finished last dose on 05/01/2016. Symptoms resolved. Woke up this morning with burning with urination and urinary frequency. Denies lower back pain, fever, or chills. Requesting recommendations from Ria CommentPatricia Grubb, FNP on next steps. Advised RN will review with Ria CommentPatricia Grubb, FNP and return call. Patient is agreeable.  Written by Ria CommentGrubb, Patricia, FNP on 04/27/2016 9:55 AM  Corrie DandyMary, The urine culture was negative except for vaginal contamination. You may stop the antibiotics of Macrobid. If these symptoms were from urethritis as we discussed the 3 days of Macrobid would have treated. If any problems persist please call.

## 2016-05-04 NOTE — Telephone Encounter (Signed)
Patient was in recently for a yeast infection and notice some burning with urination this morning.

## 2016-05-04 NOTE — Patient Instructions (Signed)
Urinary Tract Infection, Adult Introduction A urinary tract infection (UTI) is an infection of any part of the urinary tract. The urinary tract includes the:  Kidneys.  Ureters.  Bladder.  Urethra. These organs make, store, and get rid of pee (urine) in the body. Follow these instructions at home:  Take over-the-counter and prescription medicines only as told by your doctor.  If you were prescribed an antibiotic medicine, take it as told by your doctor. Do not stop taking the antibiotic even if you start to feel better.  Avoid the following drinks:  Alcohol.  Caffeine.  Tea.  Carbonated drinks.  Drink enough fluid to keep your pee clear or pale yellow.  Keep all follow-up visits as told by your doctor. This is important.  Make sure to:  Empty your bladder often and completely. Do not to hold pee for long periods of time.  Empty your bladder before and after sex.  Wipe from front to back after a bowel movement if you are female. Use each tissue one time when you wipe. Contact a doctor if:  You have back pain.  You have a fever.  You feel sick to your stomach (nauseous).  You throw up (vomit).  Your symptoms do not get better after 3 days.  Your symptoms go away and then come back. Get help right away if:  You have very bad back pain.  You have very bad lower belly (abdominal) pain.  You are throwing up and cannot keep down any medicines or water. This information is not intended to replace advice given to you by your health care provider. Make sure you discuss any questions you have with your health care provider. Document Released: 08/29/2007 Document Revised: 08/18/2015 Document Reviewed: 01/31/2015  2017 Elsevier  

## 2016-05-04 NOTE — Progress Notes (Signed)
66 y.o.Married Caucasian female G2P2002 here with complaint of UTI, with onset  on burning with urination and dysuria. Patient complaining of:  dysuria. Patient denies fever, chills, nausea or back pain. No new personal products. Patient feels not related to sexual activity this occurrence. Denies vaginal symptoms.    Contraception is hysterectomy.  No change in partner.  Menopausal with vaginal dryness. Patient has adequate water intake   O: Healthy female WDWN Affect: Normal, orientation x 3 Skin : warm and dry CVAT: negative bilateral Abdomen: negative for suprapubic tenderness  Pelvic exam: External genital area: normal, no lesions Bladder,Urethra tender, Urethral meatus: normal Vagina: normal vaginal discharge, appearance with pelvic floor weakness and atrophy with prolapse almost to the introitus. Cervix: absent Uterus: absent   POCT:  Cloudy, 3+ RBC, moderate leuk's  A:  R/O  UTI  Atrophic vaginitis  P: Reviewed findings of UTI and need for treatment. Rx:  Will start on Cipro 500 mg # 14 and Pyridium prn She is given Diflucan in case of yeast vaginitis For the vaginal atrophy - will start her on Premarin vaginal cream 0.5 gm hs X 10-14 days then twice a week.  She also will be using  a "pea" size amount at the urethra. Then she goes to twice a week vaginally Counseled with risk of CVA, DVT, cancer, etc. ZOX:WRUEALab:Urine micro, culture Reviewed warning signs and symptoms of UTI and need to advise if occurring. Encouraged to limit soda, tea, and coffee   RV prn

## 2016-05-04 NOTE — Telephone Encounter (Signed)
Spoke with patient. Advised of message as seen below from Ria CommentPatricia Grubb, FNP. Patient verbalizes understanding. Appointment scheduled for today at 1:45 pm.  Routing to provider for final review. Patient agreeable to disposition. Will close encounter.

## 2016-05-04 NOTE — Telephone Encounter (Signed)
She has been treated for UTI that C&S was negative.  I still think she might have had urethritis due to her symptoms.  The Affirm test was negative.  But since she took all of meds she may have gotten yeast infection.  The other thought is atrophic vaginitis.  If possible because of urine symptoms can she come back in today?  We can check both .

## 2016-05-05 LAB — URINALYSIS, MICROSCOPIC ONLY
Casts: NONE SEEN [LPF]
Crystals: NONE SEEN [HPF]
WBC, UA: >60 WBC/HPF — AB (ref <=5)
Yeast: NONE SEEN [HPF]

## 2016-05-06 LAB — URINE CULTURE

## 2016-05-06 NOTE — Progress Notes (Signed)
Encounter reviewed by Dr. Manha Amato Amundson C. Silva.  

## 2016-05-11 DIAGNOSIS — M25561 Pain in right knee: Secondary | ICD-10-CM | POA: Diagnosis not present

## 2016-05-17 DIAGNOSIS — M25561 Pain in right knee: Secondary | ICD-10-CM | POA: Diagnosis not present

## 2016-05-17 DIAGNOSIS — S83282A Other tear of lateral meniscus, current injury, left knee, initial encounter: Secondary | ICD-10-CM | POA: Diagnosis not present

## 2016-05-17 DIAGNOSIS — M1712 Unilateral primary osteoarthritis, left knee: Secondary | ICD-10-CM | POA: Diagnosis not present

## 2016-05-17 DIAGNOSIS — M9420 Chondromalacia, unspecified site: Secondary | ICD-10-CM | POA: Diagnosis not present

## 2016-06-21 DIAGNOSIS — M94261 Chondromalacia, right knee: Secondary | ICD-10-CM | POA: Diagnosis not present

## 2016-06-21 DIAGNOSIS — G8918 Other acute postprocedural pain: Secondary | ICD-10-CM | POA: Diagnosis not present

## 2016-06-21 DIAGNOSIS — M23221 Derangement of posterior horn of medial meniscus due to old tear or injury, right knee: Secondary | ICD-10-CM | POA: Diagnosis not present

## 2016-06-21 DIAGNOSIS — M23261 Derangement of other lateral meniscus due to old tear or injury, right knee: Secondary | ICD-10-CM | POA: Diagnosis not present

## 2016-06-28 ENCOUNTER — Other Ambulatory Visit: Payer: Self-pay | Admitting: Gastroenterology

## 2016-06-28 DIAGNOSIS — R1310 Dysphagia, unspecified: Secondary | ICD-10-CM

## 2016-07-04 DIAGNOSIS — N289 Disorder of kidney and ureter, unspecified: Secondary | ICD-10-CM | POA: Diagnosis not present

## 2016-07-06 ENCOUNTER — Inpatient Hospital Stay: Admission: RE | Admit: 2016-07-06 | Payer: BLUE CROSS/BLUE SHIELD | Source: Ambulatory Visit

## 2016-07-06 ENCOUNTER — Ambulatory Visit
Admission: RE | Admit: 2016-07-06 | Discharge: 2016-07-06 | Disposition: A | Discharge status: Home / Self Care | Payer: BLUE CROSS/BLUE SHIELD | Source: Ambulatory Visit | Attending: Gastroenterology | Admitting: Gastroenterology

## 2016-07-06 DIAGNOSIS — R1310 Dysphagia, unspecified: Secondary | ICD-10-CM | POA: Diagnosis not present

## 2016-07-25 DIAGNOSIS — Z4789 Encounter for other orthopedic aftercare: Secondary | ICD-10-CM | POA: Diagnosis not present

## 2016-07-25 DIAGNOSIS — M25561 Pain in right knee: Secondary | ICD-10-CM | POA: Diagnosis not present

## 2016-07-25 DIAGNOSIS — M170 Bilateral primary osteoarthritis of knee: Secondary | ICD-10-CM | POA: Diagnosis not present

## 2016-07-25 DIAGNOSIS — M25562 Pain in left knee: Secondary | ICD-10-CM | POA: Diagnosis not present

## 2016-07-26 DIAGNOSIS — N289 Disorder of kidney and ureter, unspecified: Secondary | ICD-10-CM | POA: Diagnosis not present

## 2016-09-05 DIAGNOSIS — M47812 Spondylosis without myelopathy or radiculopathy, cervical region: Secondary | ICD-10-CM | POA: Diagnosis not present

## 2016-09-05 DIAGNOSIS — M9901 Segmental and somatic dysfunction of cervical region: Secondary | ICD-10-CM | POA: Diagnosis not present

## 2016-09-11 DIAGNOSIS — M47812 Spondylosis without myelopathy or radiculopathy, cervical region: Secondary | ICD-10-CM | POA: Diagnosis not present

## 2016-09-11 DIAGNOSIS — M9901 Segmental and somatic dysfunction of cervical region: Secondary | ICD-10-CM | POA: Diagnosis not present

## 2016-09-12 DIAGNOSIS — M9901 Segmental and somatic dysfunction of cervical region: Secondary | ICD-10-CM | POA: Diagnosis not present

## 2016-09-12 DIAGNOSIS — M47812 Spondylosis without myelopathy or radiculopathy, cervical region: Secondary | ICD-10-CM | POA: Diagnosis not present

## 2016-09-18 DIAGNOSIS — M47812 Spondylosis without myelopathy or radiculopathy, cervical region: Secondary | ICD-10-CM | POA: Diagnosis not present

## 2016-09-18 DIAGNOSIS — M9901 Segmental and somatic dysfunction of cervical region: Secondary | ICD-10-CM | POA: Diagnosis not present

## 2016-09-27 DIAGNOSIS — Z0000 Encounter for general adult medical examination without abnormal findings: Secondary | ICD-10-CM | POA: Diagnosis not present

## 2016-09-27 DIAGNOSIS — E7800 Pure hypercholesterolemia, unspecified: Secondary | ICD-10-CM | POA: Diagnosis not present

## 2016-09-27 DIAGNOSIS — I10 Essential (primary) hypertension: Secondary | ICD-10-CM | POA: Diagnosis not present

## 2016-09-27 DIAGNOSIS — Z1211 Encounter for screening for malignant neoplasm of colon: Secondary | ICD-10-CM | POA: Diagnosis not present

## 2016-09-28 DIAGNOSIS — M170 Bilateral primary osteoarthritis of knee: Secondary | ICD-10-CM | POA: Diagnosis not present

## 2016-10-04 DIAGNOSIS — M170 Bilateral primary osteoarthritis of knee: Secondary | ICD-10-CM | POA: Diagnosis not present

## 2016-10-10 DIAGNOSIS — M9901 Segmental and somatic dysfunction of cervical region: Secondary | ICD-10-CM | POA: Diagnosis not present

## 2016-10-10 DIAGNOSIS — M47812 Spondylosis without myelopathy or radiculopathy, cervical region: Secondary | ICD-10-CM | POA: Diagnosis not present

## 2016-10-11 DIAGNOSIS — M170 Bilateral primary osteoarthritis of knee: Secondary | ICD-10-CM | POA: Diagnosis not present

## 2016-11-28 DIAGNOSIS — M170 Bilateral primary osteoarthritis of knee: Secondary | ICD-10-CM | POA: Diagnosis not present

## 2016-11-28 DIAGNOSIS — M25561 Pain in right knee: Secondary | ICD-10-CM | POA: Diagnosis not present

## 2016-11-28 DIAGNOSIS — M25562 Pain in left knee: Secondary | ICD-10-CM | POA: Diagnosis not present

## 2016-11-28 DIAGNOSIS — G8929 Other chronic pain: Secondary | ICD-10-CM | POA: Diagnosis not present

## 2016-12-05 DIAGNOSIS — Z23 Encounter for immunization: Secondary | ICD-10-CM | POA: Diagnosis not present

## 2016-12-11 DIAGNOSIS — M47816 Spondylosis without myelopathy or radiculopathy, lumbar region: Secondary | ICD-10-CM | POA: Diagnosis not present

## 2016-12-11 DIAGNOSIS — M9903 Segmental and somatic dysfunction of lumbar region: Secondary | ICD-10-CM | POA: Diagnosis not present

## 2016-12-11 DIAGNOSIS — M9906 Segmental and somatic dysfunction of lower extremity: Secondary | ICD-10-CM | POA: Diagnosis not present

## 2016-12-11 DIAGNOSIS — S83422A Sprain of lateral collateral ligament of left knee, initial encounter: Secondary | ICD-10-CM | POA: Diagnosis not present

## 2016-12-12 DIAGNOSIS — S83422A Sprain of lateral collateral ligament of left knee, initial encounter: Secondary | ICD-10-CM | POA: Diagnosis not present

## 2016-12-12 DIAGNOSIS — M9903 Segmental and somatic dysfunction of lumbar region: Secondary | ICD-10-CM | POA: Diagnosis not present

## 2016-12-12 DIAGNOSIS — M47816 Spondylosis without myelopathy or radiculopathy, lumbar region: Secondary | ICD-10-CM | POA: Diagnosis not present

## 2016-12-12 DIAGNOSIS — M9906 Segmental and somatic dysfunction of lower extremity: Secondary | ICD-10-CM | POA: Diagnosis not present

## 2016-12-17 DIAGNOSIS — M47816 Spondylosis without myelopathy or radiculopathy, lumbar region: Secondary | ICD-10-CM | POA: Diagnosis not present

## 2016-12-17 DIAGNOSIS — M9903 Segmental and somatic dysfunction of lumbar region: Secondary | ICD-10-CM | POA: Diagnosis not present

## 2016-12-17 DIAGNOSIS — S83422A Sprain of lateral collateral ligament of left knee, initial encounter: Secondary | ICD-10-CM | POA: Diagnosis not present

## 2016-12-17 DIAGNOSIS — M9906 Segmental and somatic dysfunction of lower extremity: Secondary | ICD-10-CM | POA: Diagnosis not present

## 2016-12-18 DIAGNOSIS — M9906 Segmental and somatic dysfunction of lower extremity: Secondary | ICD-10-CM | POA: Diagnosis not present

## 2016-12-18 DIAGNOSIS — S83422A Sprain of lateral collateral ligament of left knee, initial encounter: Secondary | ICD-10-CM | POA: Diagnosis not present

## 2016-12-18 DIAGNOSIS — M9903 Segmental and somatic dysfunction of lumbar region: Secondary | ICD-10-CM | POA: Diagnosis not present

## 2016-12-18 DIAGNOSIS — M47816 Spondylosis without myelopathy or radiculopathy, lumbar region: Secondary | ICD-10-CM | POA: Diagnosis not present

## 2016-12-25 DIAGNOSIS — R300 Dysuria: Secondary | ICD-10-CM | POA: Diagnosis not present

## 2016-12-25 DIAGNOSIS — M47816 Spondylosis without myelopathy or radiculopathy, lumbar region: Secondary | ICD-10-CM | POA: Diagnosis not present

## 2016-12-25 DIAGNOSIS — M9906 Segmental and somatic dysfunction of lower extremity: Secondary | ICD-10-CM | POA: Diagnosis not present

## 2016-12-25 DIAGNOSIS — S83422A Sprain of lateral collateral ligament of left knee, initial encounter: Secondary | ICD-10-CM | POA: Diagnosis not present

## 2016-12-25 DIAGNOSIS — M9903 Segmental and somatic dysfunction of lumbar region: Secondary | ICD-10-CM | POA: Diagnosis not present

## 2016-12-26 DIAGNOSIS — M1712 Unilateral primary osteoarthritis, left knee: Secondary | ICD-10-CM | POA: Diagnosis not present

## 2016-12-26 DIAGNOSIS — M9903 Segmental and somatic dysfunction of lumbar region: Secondary | ICD-10-CM | POA: Diagnosis not present

## 2016-12-26 DIAGNOSIS — M47816 Spondylosis without myelopathy or radiculopathy, lumbar region: Secondary | ICD-10-CM | POA: Diagnosis not present

## 2016-12-26 DIAGNOSIS — M9906 Segmental and somatic dysfunction of lower extremity: Secondary | ICD-10-CM | POA: Diagnosis not present

## 2016-12-26 DIAGNOSIS — S83422A Sprain of lateral collateral ligament of left knee, initial encounter: Secondary | ICD-10-CM | POA: Diagnosis not present

## 2016-12-31 DIAGNOSIS — S83422A Sprain of lateral collateral ligament of left knee, initial encounter: Secondary | ICD-10-CM | POA: Diagnosis not present

## 2016-12-31 DIAGNOSIS — M9906 Segmental and somatic dysfunction of lower extremity: Secondary | ICD-10-CM | POA: Diagnosis not present

## 2016-12-31 DIAGNOSIS — M9903 Segmental and somatic dysfunction of lumbar region: Secondary | ICD-10-CM | POA: Diagnosis not present

## 2016-12-31 DIAGNOSIS — M47816 Spondylosis without myelopathy or radiculopathy, lumbar region: Secondary | ICD-10-CM | POA: Diagnosis not present

## 2017-01-02 DIAGNOSIS — M9906 Segmental and somatic dysfunction of lower extremity: Secondary | ICD-10-CM | POA: Diagnosis not present

## 2017-01-02 DIAGNOSIS — M9903 Segmental and somatic dysfunction of lumbar region: Secondary | ICD-10-CM | POA: Diagnosis not present

## 2017-01-02 DIAGNOSIS — M47816 Spondylosis without myelopathy or radiculopathy, lumbar region: Secondary | ICD-10-CM | POA: Diagnosis not present

## 2017-01-02 DIAGNOSIS — S83422A Sprain of lateral collateral ligament of left knee, initial encounter: Secondary | ICD-10-CM | POA: Diagnosis not present

## 2017-01-07 DIAGNOSIS — M47816 Spondylosis without myelopathy or radiculopathy, lumbar region: Secondary | ICD-10-CM | POA: Diagnosis not present

## 2017-01-07 DIAGNOSIS — M9903 Segmental and somatic dysfunction of lumbar region: Secondary | ICD-10-CM | POA: Diagnosis not present

## 2017-01-07 DIAGNOSIS — S83422A Sprain of lateral collateral ligament of left knee, initial encounter: Secondary | ICD-10-CM | POA: Diagnosis not present

## 2017-01-07 DIAGNOSIS — M9906 Segmental and somatic dysfunction of lower extremity: Secondary | ICD-10-CM | POA: Diagnosis not present

## 2017-01-09 DIAGNOSIS — S83422A Sprain of lateral collateral ligament of left knee, initial encounter: Secondary | ICD-10-CM | POA: Diagnosis not present

## 2017-01-09 DIAGNOSIS — M47816 Spondylosis without myelopathy or radiculopathy, lumbar region: Secondary | ICD-10-CM | POA: Diagnosis not present

## 2017-01-09 DIAGNOSIS — M9906 Segmental and somatic dysfunction of lower extremity: Secondary | ICD-10-CM | POA: Diagnosis not present

## 2017-01-09 DIAGNOSIS — M9903 Segmental and somatic dysfunction of lumbar region: Secondary | ICD-10-CM | POA: Diagnosis not present

## 2017-01-14 DIAGNOSIS — M9906 Segmental and somatic dysfunction of lower extremity: Secondary | ICD-10-CM | POA: Diagnosis not present

## 2017-01-14 DIAGNOSIS — M9903 Segmental and somatic dysfunction of lumbar region: Secondary | ICD-10-CM | POA: Diagnosis not present

## 2017-01-14 DIAGNOSIS — M47816 Spondylosis without myelopathy or radiculopathy, lumbar region: Secondary | ICD-10-CM | POA: Diagnosis not present

## 2017-01-14 DIAGNOSIS — S83422A Sprain of lateral collateral ligament of left knee, initial encounter: Secondary | ICD-10-CM | POA: Diagnosis not present

## 2017-01-17 DIAGNOSIS — M25562 Pain in left knee: Secondary | ICD-10-CM | POA: Diagnosis not present

## 2017-01-21 DIAGNOSIS — M47816 Spondylosis without myelopathy or radiculopathy, lumbar region: Secondary | ICD-10-CM | POA: Diagnosis not present

## 2017-01-21 DIAGNOSIS — M9903 Segmental and somatic dysfunction of lumbar region: Secondary | ICD-10-CM | POA: Diagnosis not present

## 2017-01-21 DIAGNOSIS — S83422A Sprain of lateral collateral ligament of left knee, initial encounter: Secondary | ICD-10-CM | POA: Diagnosis not present

## 2017-01-21 DIAGNOSIS — M9906 Segmental and somatic dysfunction of lower extremity: Secondary | ICD-10-CM | POA: Diagnosis not present

## 2017-01-23 DIAGNOSIS — M47816 Spondylosis without myelopathy or radiculopathy, lumbar region: Secondary | ICD-10-CM | POA: Diagnosis not present

## 2017-01-23 DIAGNOSIS — S83422A Sprain of lateral collateral ligament of left knee, initial encounter: Secondary | ICD-10-CM | POA: Diagnosis not present

## 2017-01-23 DIAGNOSIS — M9906 Segmental and somatic dysfunction of lower extremity: Secondary | ICD-10-CM | POA: Diagnosis not present

## 2017-01-23 DIAGNOSIS — M9903 Segmental and somatic dysfunction of lumbar region: Secondary | ICD-10-CM | POA: Diagnosis not present

## 2017-01-24 ENCOUNTER — Encounter: Payer: Self-pay | Admitting: Physician Assistant

## 2017-01-24 DIAGNOSIS — Z96652 Presence of left artificial knee joint: Secondary | ICD-10-CM | POA: Insufficient documentation

## 2017-01-24 NOTE — Progress Notes (Signed)
Chart opened in error; name was listed on documentation sent to our office and initially thought to be patient name but does not match patient age. Stated name appeared to be sender of fax. Closed record immediately. Charleston Vierling PA-C

## 2017-01-31 DIAGNOSIS — M1712 Unilateral primary osteoarthritis, left knee: Secondary | ICD-10-CM | POA: Diagnosis not present

## 2017-03-25 ENCOUNTER — Ambulatory Visit: Payer: BLUE CROSS/BLUE SHIELD | Admitting: Nurse Practitioner

## 2017-04-01 DIAGNOSIS — M9901 Segmental and somatic dysfunction of cervical region: Secondary | ICD-10-CM | POA: Diagnosis not present

## 2017-04-01 DIAGNOSIS — M9907 Segmental and somatic dysfunction of upper extremity: Secondary | ICD-10-CM | POA: Diagnosis not present

## 2017-04-01 DIAGNOSIS — M4722 Other spondylosis with radiculopathy, cervical region: Secondary | ICD-10-CM | POA: Diagnosis not present

## 2017-04-01 DIAGNOSIS — S43422A Sprain of left rotator cuff capsule, initial encounter: Secondary | ICD-10-CM | POA: Diagnosis not present

## 2017-04-11 ENCOUNTER — Ambulatory Visit: Payer: BLUE CROSS/BLUE SHIELD | Admitting: Obstetrics and Gynecology

## 2017-04-11 DIAGNOSIS — K219 Gastro-esophageal reflux disease without esophagitis: Secondary | ICD-10-CM | POA: Diagnosis not present

## 2017-04-11 DIAGNOSIS — Z1211 Encounter for screening for malignant neoplasm of colon: Secondary | ICD-10-CM | POA: Diagnosis not present

## 2017-04-11 DIAGNOSIS — R1310 Dysphagia, unspecified: Secondary | ICD-10-CM | POA: Diagnosis not present

## 2017-04-11 DIAGNOSIS — E669 Obesity, unspecified: Secondary | ICD-10-CM | POA: Diagnosis not present

## 2017-04-24 DIAGNOSIS — S161XXA Strain of muscle, fascia and tendon at neck level, initial encounter: Secondary | ICD-10-CM | POA: Diagnosis not present

## 2017-04-24 DIAGNOSIS — E559 Vitamin D deficiency, unspecified: Secondary | ICD-10-CM | POA: Diagnosis not present

## 2017-04-24 DIAGNOSIS — R5383 Other fatigue: Secondary | ICD-10-CM | POA: Diagnosis not present

## 2017-06-04 DIAGNOSIS — R509 Fever, unspecified: Secondary | ICD-10-CM | POA: Diagnosis not present

## 2017-06-04 DIAGNOSIS — B349 Viral infection, unspecified: Secondary | ICD-10-CM | POA: Diagnosis not present

## 2017-08-23 DIAGNOSIS — Z1211 Encounter for screening for malignant neoplasm of colon: Secondary | ICD-10-CM | POA: Diagnosis not present

## 2017-08-23 DIAGNOSIS — R1310 Dysphagia, unspecified: Secondary | ICD-10-CM | POA: Diagnosis not present

## 2017-08-23 DIAGNOSIS — K6389 Other specified diseases of intestine: Secondary | ICD-10-CM | POA: Diagnosis not present

## 2017-08-23 DIAGNOSIS — D120 Benign neoplasm of cecum: Secondary | ICD-10-CM | POA: Diagnosis not present

## 2017-08-23 DIAGNOSIS — K219 Gastro-esophageal reflux disease without esophagitis: Secondary | ICD-10-CM | POA: Diagnosis not present

## 2017-08-23 DIAGNOSIS — R1013 Epigastric pain: Secondary | ICD-10-CM | POA: Diagnosis not present

## 2017-08-23 DIAGNOSIS — K635 Polyp of colon: Secondary | ICD-10-CM | POA: Diagnosis not present

## 2017-09-12 DIAGNOSIS — M25562 Pain in left knee: Secondary | ICD-10-CM | POA: Diagnosis not present

## 2017-10-17 DIAGNOSIS — E7800 Pure hypercholesterolemia, unspecified: Secondary | ICD-10-CM | POA: Diagnosis not present

## 2017-10-17 DIAGNOSIS — Z23 Encounter for immunization: Secondary | ICD-10-CM | POA: Diagnosis not present

## 2017-10-17 DIAGNOSIS — I10 Essential (primary) hypertension: Secondary | ICD-10-CM | POA: Diagnosis not present

## 2017-10-17 DIAGNOSIS — Z0000 Encounter for general adult medical examination without abnormal findings: Secondary | ICD-10-CM | POA: Diagnosis not present

## 2017-11-21 DIAGNOSIS — M1711 Unilateral primary osteoarthritis, right knee: Secondary | ICD-10-CM | POA: Diagnosis not present

## 2017-11-21 DIAGNOSIS — M25561 Pain in right knee: Secondary | ICD-10-CM | POA: Diagnosis not present

## 2017-11-21 DIAGNOSIS — Z96652 Presence of left artificial knee joint: Secondary | ICD-10-CM | POA: Diagnosis not present

## 2017-12-02 DIAGNOSIS — M530 Cervicocranial syndrome: Secondary | ICD-10-CM | POA: Diagnosis not present

## 2017-12-02 DIAGNOSIS — M9901 Segmental and somatic dysfunction of cervical region: Secondary | ICD-10-CM | POA: Diagnosis not present

## 2017-12-03 DIAGNOSIS — M530 Cervicocranial syndrome: Secondary | ICD-10-CM | POA: Diagnosis not present

## 2017-12-03 DIAGNOSIS — M9901 Segmental and somatic dysfunction of cervical region: Secondary | ICD-10-CM | POA: Diagnosis not present

## 2017-12-04 DIAGNOSIS — M9901 Segmental and somatic dysfunction of cervical region: Secondary | ICD-10-CM | POA: Diagnosis not present

## 2017-12-04 DIAGNOSIS — M530 Cervicocranial syndrome: Secondary | ICD-10-CM | POA: Diagnosis not present

## 2017-12-09 DIAGNOSIS — M530 Cervicocranial syndrome: Secondary | ICD-10-CM | POA: Diagnosis not present

## 2017-12-09 DIAGNOSIS — M9901 Segmental and somatic dysfunction of cervical region: Secondary | ICD-10-CM | POA: Diagnosis not present

## 2017-12-16 DIAGNOSIS — M530 Cervicocranial syndrome: Secondary | ICD-10-CM | POA: Diagnosis not present

## 2017-12-16 DIAGNOSIS — M9901 Segmental and somatic dysfunction of cervical region: Secondary | ICD-10-CM | POA: Diagnosis not present

## 2017-12-18 DIAGNOSIS — M9901 Segmental and somatic dysfunction of cervical region: Secondary | ICD-10-CM | POA: Diagnosis not present

## 2017-12-18 DIAGNOSIS — M530 Cervicocranial syndrome: Secondary | ICD-10-CM | POA: Diagnosis not present

## 2017-12-19 DIAGNOSIS — J0100 Acute maxillary sinusitis, unspecified: Secondary | ICD-10-CM | POA: Diagnosis not present

## 2018-01-02 DIAGNOSIS — R05 Cough: Secondary | ICD-10-CM | POA: Diagnosis not present

## 2018-01-16 DIAGNOSIS — M1711 Unilateral primary osteoarthritis, right knee: Secondary | ICD-10-CM | POA: Diagnosis not present

## 2018-01-20 ENCOUNTER — Ambulatory Visit
Admission: RE | Admit: 2018-01-20 | Discharge: 2018-01-20 | Disposition: A | Discharge status: Home / Self Care | Payer: BLUE CROSS/BLUE SHIELD | Source: Ambulatory Visit | Attending: Family Medicine | Admitting: Family Medicine

## 2018-01-20 ENCOUNTER — Other Ambulatory Visit: Payer: Self-pay | Admitting: Family Medicine

## 2018-01-20 DIAGNOSIS — R059 Cough, unspecified: Secondary | ICD-10-CM

## 2018-01-20 DIAGNOSIS — R05 Cough: Secondary | ICD-10-CM

## 2018-02-10 DIAGNOSIS — M1711 Unilateral primary osteoarthritis, right knee: Secondary | ICD-10-CM | POA: Diagnosis not present

## 2018-03-20 DIAGNOSIS — J069 Acute upper respiratory infection, unspecified: Secondary | ICD-10-CM | POA: Diagnosis not present

## 2018-04-02 DIAGNOSIS — Z471 Aftercare following joint replacement surgery: Secondary | ICD-10-CM | POA: Diagnosis not present

## 2018-04-02 DIAGNOSIS — Z96651 Presence of right artificial knee joint: Secondary | ICD-10-CM | POA: Diagnosis not present

## 2018-06-09 DIAGNOSIS — N390 Urinary tract infection, site not specified: Secondary | ICD-10-CM | POA: Diagnosis not present

## 2018-06-25 ENCOUNTER — Ambulatory Visit: Payer: BLUE CROSS/BLUE SHIELD | Admitting: Obstetrics and Gynecology

## 2018-06-27 DIAGNOSIS — R300 Dysuria: Secondary | ICD-10-CM | POA: Diagnosis not present

## 2018-06-27 DIAGNOSIS — N390 Urinary tract infection, site not specified: Secondary | ICD-10-CM | POA: Diagnosis not present

## 2018-07-09 IMAGING — MR MR HEAD WO/W CM
10 of 11 series · 32 of 48 positions shown · IV contrast (multihance)
Comparison: MRI head 01/19/2008

CLINICAL DATA: Asymmetric right sensorineural hearing loss.
Right-sided tinnitus

Creatinine was obtained on site at [HOSPITAL] at [HOSPITAL].
Results: Creatinine 0.9 mg/dL.
EXAM:
MRI HEAD WITHOUT AND WITH CONTRAST
TECHNIQUE: Multiplanar, multiecho pulse sequences of the brain and surrounding
structures were obtained without and with intravenous contrast.
CONTRAST:  20mL MULTIHANCE GADOBENATE DIMEGLUMINE 529 MG/ML IV SOLN

[Series 2: T1 · sagittal · 5.0mm · 0.45mm/px · 4 of 21 slices shown (1 of 3)]
[im 1/21]
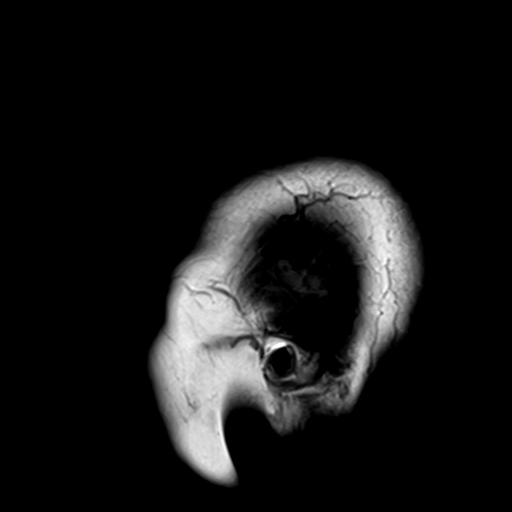
[im 7/21]
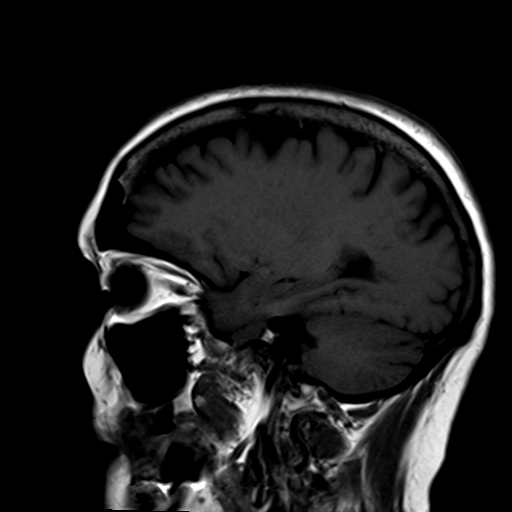
[im 14/21]
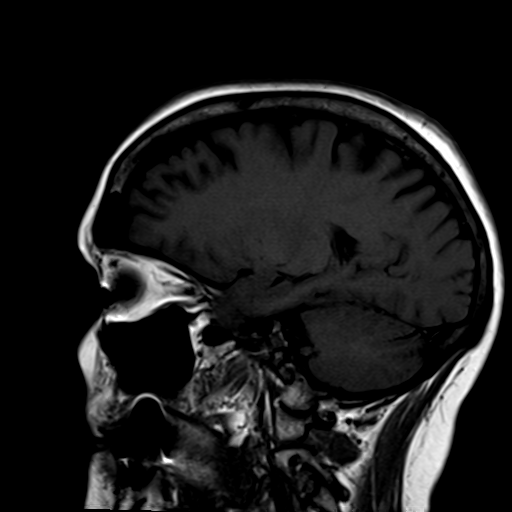
[im 21/21]
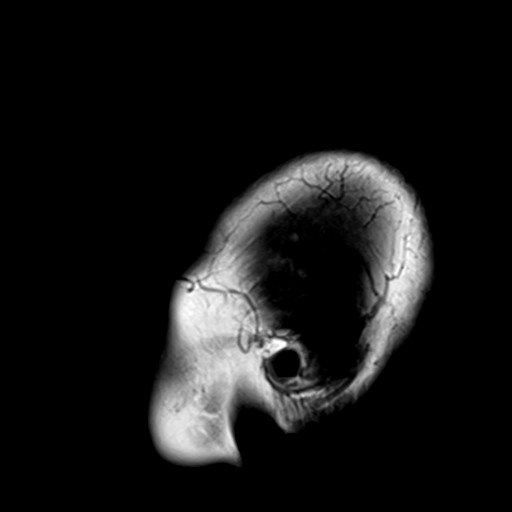

[Series 3: DWI · axial · 3.0mm · 1.80mm/px · z∈[+2,+148]mm · 8 of 100 slices shown (1 of 2)]
[im 1/100]
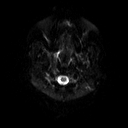
[im 16/100]
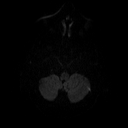
[im 31/100]
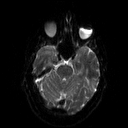
[im 46/100]
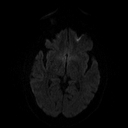
[im 54/100]
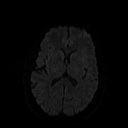
[im 69/100]
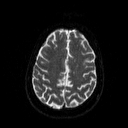
[im 84/100]
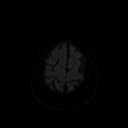
[im 100/100]
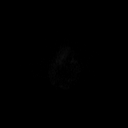

[Series 4: DWI · axial · 3.0mm · 1.80mm/px · z∈[+2,+148]mm · 6 of 48 slices shown (2 of 2)]
[im 1/48]
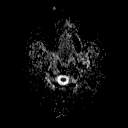
[im 10/48]
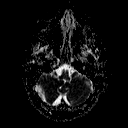
[im 19/48]
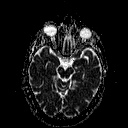
[im 29/48]
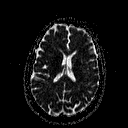
[im 38/48]
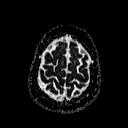
[im 48/48]
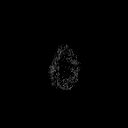

[Series 5: T2 · axial · 5.0mm · 0.45mm/px · z∈[+4,+146]mm · 3 of 23 slices shown]
[im 1/23]
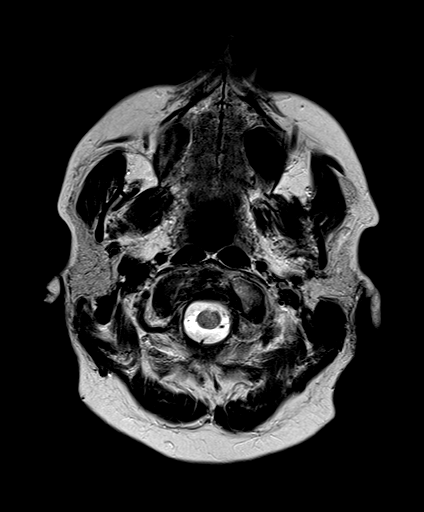
[im 12/23]
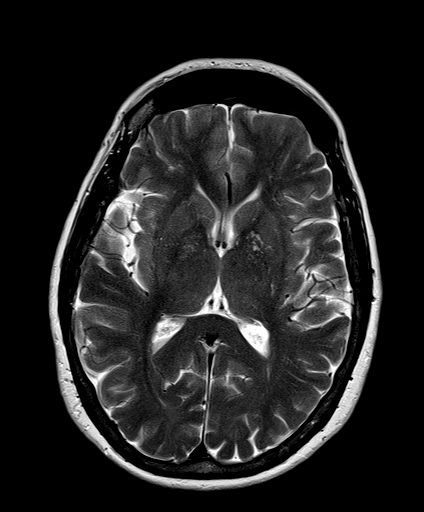
[im 23/23]
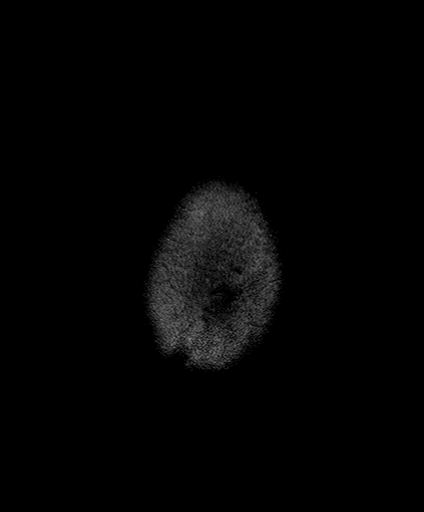

[Series 6: FLAIR · axial · 5.0mm · 0.45mm/px · z∈[+4,+146]mm · 3 of 23 slices shown]
[im 1/23]
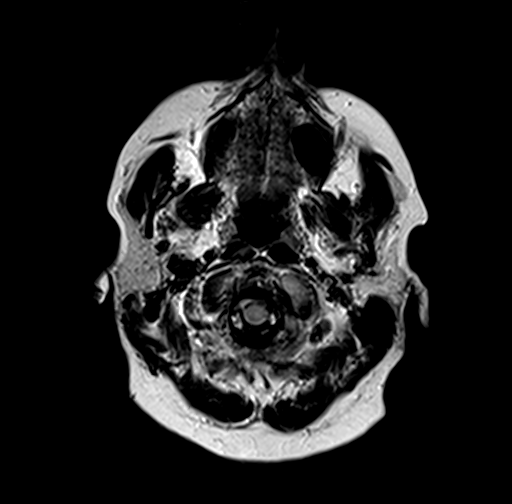
[im 12/23]
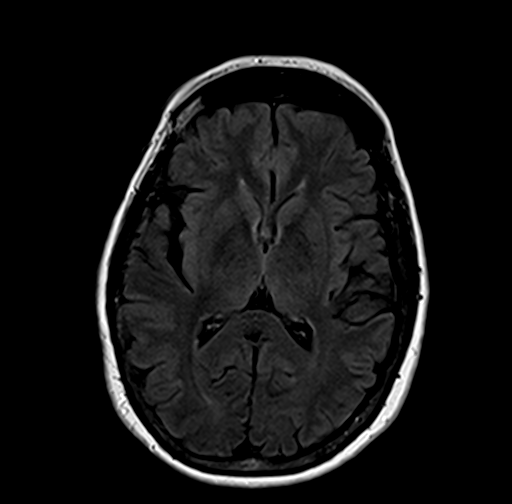
[im 23/23]
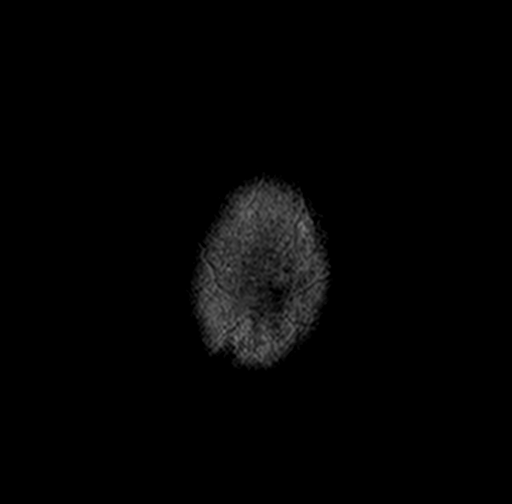

[Series 7: T1 · coronal · 3.0mm · 0.35mm/px · 1 of 11 slices shown (2 of 3)]
[im 1/11]
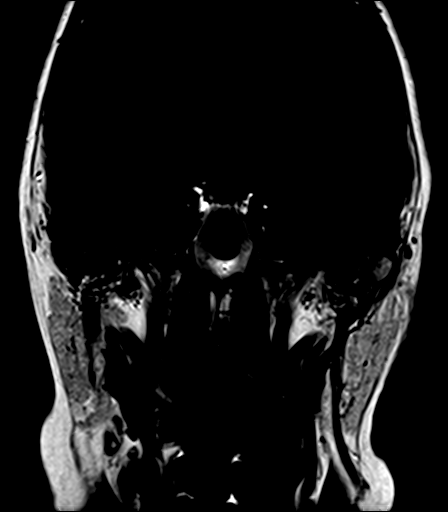

[Series 8: T1 · axial · 3.0mm · 0.35mm/px · 1 of 11 slices shown (3 of 3)]
[im 1/11]
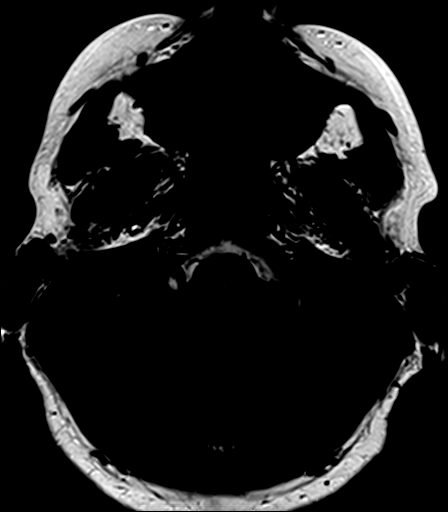

[Series 9: bSSFP · axial · 1.0mm · 0.28mm/px · z∈[+7,+33]mm · 4 of 36 slices shown]
[im 1/36]
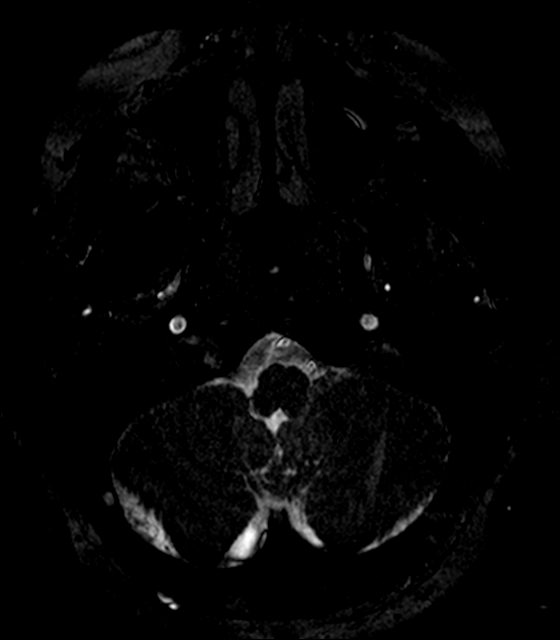
[im 9/36]
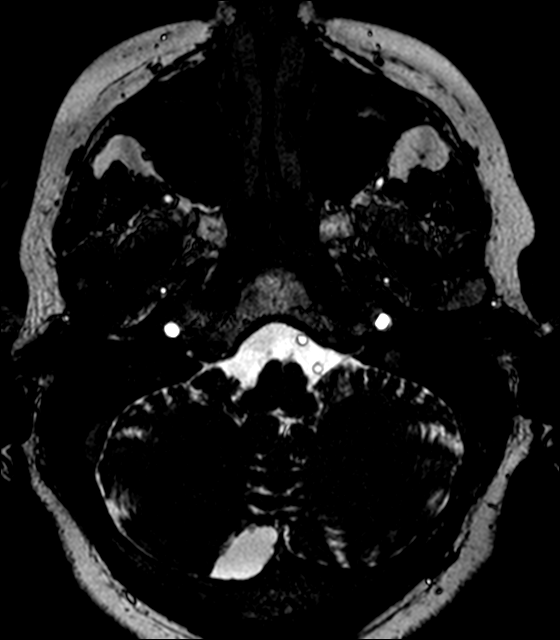
[im 18/36]
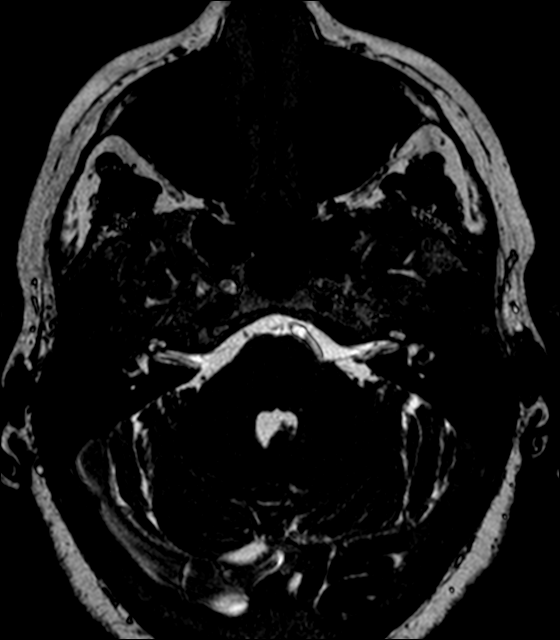
[im 27/36]
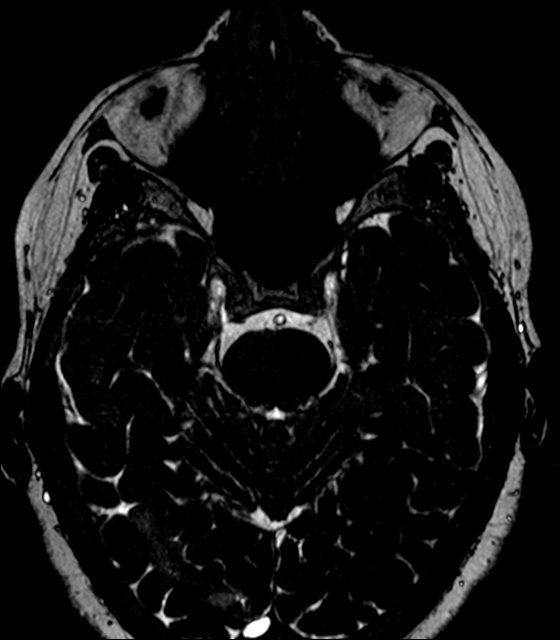

[Series 10: T1 post-contrast · coronal · 3.0mm · 0.35mm/px · 1 of 11 slices shown (1 of 2)]
[im 1/11]
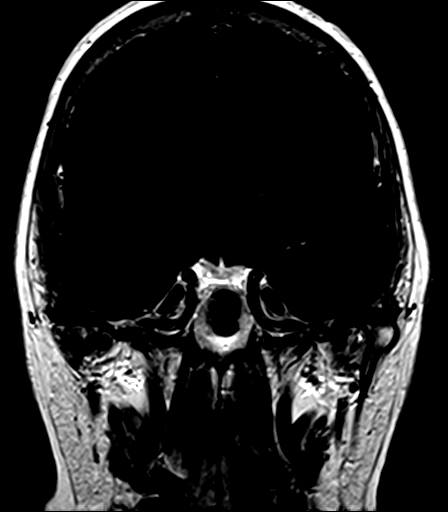

[Series 11: T1 post-contrast · axial · 3.0mm · 0.35mm/px · 1 of 11 slices shown (2 of 2)]
[im 1/11]
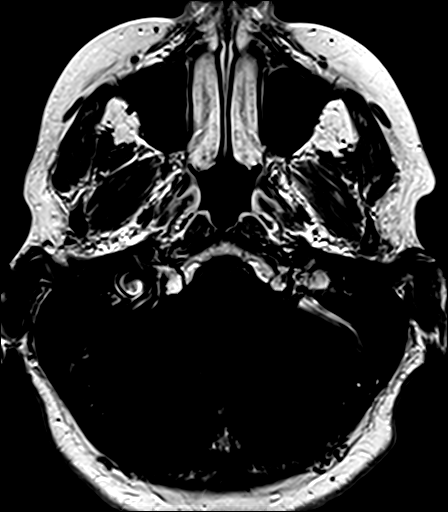

[32 of 48 positions shown; findings below may reference images not displayed]

FINDINGS: Brain: IAC protocol was performed including thin section imaging
through the posterior fossa before and after intravenous contrast.
Seventh and eighth cranial nerves normal. Negative for vestibular
schwannoma. No enhancing mass in the posterior fossa. Brainstem and
cerebellum normal. Basilar cisterns normal.

Negative for acute infarction. Small chronic infarct right thalamus.
Negative for intracranial hemorrhage or mass. Normal enhancement of
the brain

Vascular: Normal arterial flow voids.

Skull and upper cervical spine: Negative

Sinuses/Orbits: Trapped fluid in the right petrous apex with
air-fluid level. Remainder of the right mastoid sinus clear. Left
mastoid sinus is clear. Paranasal sinuses clear. Normal orbit.

Other: Negative
IMPRESSION: Trapped fluid in the right petrous apex with air-fluid level, not
present on prior studies.

Negative for vestibular schwannoma.  No mass lesion.

## 2018-08-20 ENCOUNTER — Other Ambulatory Visit (HOSPITAL_COMMUNITY): Payer: Self-pay

## 2018-08-25 NOTE — Progress Notes (Signed)
68 y.o. [id number] Married White or Caucasian Not Hispanic or Latino female here for annual exam.   H/O TVH, H/O RSO. Still has her left tube and ovary. She has a known rectocele and cystocele. Not uncomfortable. She voids frequently, normal amounts, feels empty, no leakage. Bowels are okay. Sexually active, no pain.   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   Her mom is in a local alzheimer's unit.     Patient's last menstrual period was 03/26/1988 (approximate).          Sexually active: Yes.    The current method of family planning is post hysterectomy.    Exercising: No.  The patient does not participate in regular exercise at present. Smoker:  no  Health Maintenance: Pap:  04/10/00, WNL (TVH) No h/o abnormal paps MMG: 02/08/16, Bi-Rads 2: benign, states she had one done in 2018  Colonoscopy:  07/2017 WNL BMD: 02/02/2015: Normal, -0.60 Spine / +0.30 Right Femur Neck / -0.40 Left Femur Neck TDaP:  2011 Gardasil: N/A   reports that she has never smoked. She has never used smokeless tobacco. She reports current alcohol use. She reports that she does not use drugs. [place]. 2 kids and 2 grand kids. Husband works in [employer]    Past Medical History:  Diagnosis Date  . Bell's palsy 06/2002  . Cystocele, grade 2    high  . Hyperlipidemia   . Hypertension   . Ischemic colitis (HCC) 10/08  . Rectocele    mod grade 2    Past Surgical History:  Procedure Laterality Date  . APPENDECTOMY  1974  . BACK SURGERY  4/96  . BREAST REDUCTION SURGERY Bilateral 9/02  . CERVICAL FUSION  1998   C5-C6  . CERVICAL SPINE SURGERY  01/25/11   cervical neck cage C2-4/5  . CHOLECYSTECTOMY, LAPAROSCOPIC  2/93  . LAMINECTOMY  12/95   L5-S1  . LAPAROSCOPIC ABDOMINAL EXPLORATION  11/00   LLQ pain, Dickstein  . LAPAROSCOPIC SALPINGO OOPHERECTOMY Right 1/98   Dickstein, endometriosis  . MENISCUS REPAIR Left  11/03/2015  . NECK SURGERY  10/03   plate in neck for herniated disc, 3/05 changed plate  . TONSILLECTOMY AND ADENOIDECTOMY  1972  . TOTAL VAGINAL HYSTERECTOMY  1990   pelvic relaxation, and endometriosis    Current Outpatient Medications  Medication Sig Dispense Refill  . aspirin EC 81 MG tablet Take 81 mg by mouth daily.    Marland Kitchen. atorvastatin (LIPITOR) 10 MG tablet Take 10 mg by mouth daily.    . cholecalciferol (VITAMIN D) 25 MCG (1000 UT) tablet Take 1 tablet (1,000 Units total) by mouth daily.    . Multiple Vitamin (MULTIVITAMIN) tablet Take 1 tablet by mouth daily.    . Omega-3 Fatty Acids (FISH OIL) 1000 MG CAPS Take by mouth.    . telmisartan (MICARDIS) 40 MG tablet Take 40 mg by mouth daily.    Marland Kitchen. triamterene-hydrochlorothiazide (DYAZIDE) 37.5-25 MG capsule Take 1 capsule by mouth daily.     No current facility-administered medications for this visit.     Family History  Problem Relation Age of Onset  . Colon polyps Father   . Nephrolithiasis Father   . Prostate cancer Father   . Hyperlipidemia Father   . Hypertension Father   . Bladder Cancer Father [id number]  . Osteoporosis Mother [id number]  . Heart disease Mother   . Hypertension Mother   .  Hyperlipidemia Mother   . Hyperlipidemia Brother   . Hypertension Brother   . Prostate cancer Brother   . Pneumonia Maternal Grandmother   . Heart failure Maternal Grandfather   . Cancer Paternal Grandfather   . Hyperlipidemia Brother   . Hypertension Brother   . Stroke Maternal Aunt   . Heart failure Maternal Uncle   . Prostate cancer Brother 75    Review of Systems  Constitutional: Negative.   HENT: Negative.   Eyes: Negative.   Respiratory: Negative.   Cardiovascular: Negative.   Gastrointestinal: Negative.   Endocrine: Negative.   Genitourinary: Negative.   Musculoskeletal: Negative.   Skin: Negative.   Allergic/Immunologic: Negative.   Neurological: Negative.   Hematological: Negative.   Psychiatric/Behavioral: Negative.      Exam:   BP 138/88 (BP Location: Left Arm, Patient Position: Sitting, Cuff Size: Large)   Pulse 84   Temp 97.9 F (36.6 C) (Skin)   Ht 5\' 7"  (1.702 m)   Wt 213 lb 12.8 oz (97 kg)   LMP 03/26/1988 (Approximate)   BMI 33.49 kg/m   Weight change: @WEIGHTCHANGE @ Height:   Height: 5\' 7"  (170.2 cm)  Ht Readings from Last 3 Encounters:  08/26/18 5\' 7"  (1.702 m)  05/04/16 5' 6.5" (1.689 m)  04/25/16 5' 6.5" (1.689 m)    General appearance: alert, cooperative and appears stated age Head: Normocephalic, without obvious abnormality, atraumatic Neck: no adenopathy, supple, symmetrical, trachea midline and thyroid normal to inspection and palpation Lungs: clear to auscultation bilaterally Cardiovascular: regular rate and rhythm Breasts: normal appearance, no masses or tenderness Abdomen: soft, non-tender; non distended,  no masses,  no organomegaly Extremities: extremities normal, atraumatic, no cyanosis or edema Skin: Skin color, texture, turgor normal. No rashes or lesions Lymph nodes: Cervical, supraclavicular, and axillary nodes normal. No abnormal inguinal nodes palpated Neurologic: Grossly normal   Pelvic: External genitalia:  no lesions              Urethra:  normal appearing urethra with no masses, tenderness or lesions              Bartholins and Skenes: normal                 Vagina: atrophic appearing vagina with normal color and discharge, no lesions. Grade 2 cystocele, small grade 2 rectocele, grade 1 vault prolapse. Only examined supine, with and without valsalva.               Cervix: absent               Bimanual Exam:  Uterus:  uterus absent              Adnexa: no mass, fullness, tenderness               Rectovaginal: Confirms               Anus:  normal sphincter tone, no lesions  Chaperone was present for exam.  A:  Well Woman with normal exam  Cystocele/rectocele, asymptomatic  P:   No pap needed  Colonoscopy UTD  Mammogram overdue, she will  schedule  Normal DEXA  Discussed breast self exam  Discussed calcium and vit D intake  Labs UTD  Avoid heavy lifting and straining

## 2018-08-26 ENCOUNTER — Ambulatory Visit (INDEPENDENT_AMBULATORY_CARE_PROVIDER_SITE_OTHER): Payer: BC Managed Care – PPO | Admitting: Obstetrics and Gynecology

## 2018-08-26 ENCOUNTER — Encounter: Payer: Self-pay | Admitting: Obstetrics and Gynecology

## 2018-08-26 ENCOUNTER — Other Ambulatory Visit: Payer: Self-pay

## 2018-08-26 VITALS — BP 138/88 | HR 84 | Temp 97.9°F | Ht 67.0 in | Wt 213.8 lb

## 2018-08-26 DIAGNOSIS — N8111 Cystocele, midline: Secondary | ICD-10-CM

## 2018-08-26 DIAGNOSIS — Z01419 Encounter for gynecological examination (general) (routine) without abnormal findings: Secondary | ICD-10-CM

## 2018-08-26 DIAGNOSIS — N816 Rectocele: Secondary | ICD-10-CM

## 2018-08-26 NOTE — Patient Instructions (Signed)
EXERCISE AND DIET:  We recommended that you start or continue a regular exercise program for good health. Regular exercise means any activity that makes your heart beat faster and makes you sweat.  We recommend exercising at least 30 minutes per day at least 3 days a week, preferably 4 or 5.  We also recommend a diet low in fat and sugar.  Inactivity, poor dietary choices and obesity can cause diabetes, heart attack, stroke, and kidney damage, among others.   ° °ALCOHOL AND SMOKING:  Women should limit their alcohol intake to no more than 7 drinks/beers/glasses of wine (combined, not each!) per week. Moderation of alcohol intake to this level decreases your risk of breast cancer and liver damage. And of course, no recreational drugs are part of a healthy lifestyle.  And absolutely no smoking or even second hand smoke. Most people know smoking can cause heart and lung diseases, but did you know it also contributes to weakening of your bones? Aging of your skin?  Yellowing of your teeth and nails? ° °CALCIUM AND VITAMIN D:  Adequate intake of calcium and Vitamin D are recommended.  The recommendations for exact amounts of these supplements seem to change often, but generally speaking 1,200 mg of calcium (between diet and supplement) and 800 units of Vitamin D per day seems prudent. Certain women may benefit from higher intake of Vitamin D.  If you are among these women, your doctor will have told you during your visit.   ° °PAP SMEARS:  Pap smears, to check for cervical cancer or precancers,  have traditionally been done yearly, although recent scientific advances have shown that most women can have pap smears less often.  However, every woman still should have a physical exam from her gynecologist every year. It will include a breast check, inspection of the vulva and vagina to check for abnormal growths or skin changes, a visual exam of the cervix, and then an exam to evaluate the size and shape of the uterus and  ovaries.  And after 66 years of age, a rectal exam is indicated to check for rectal cancers. We will also provide age appropriate advice regarding health maintenance, like when you should have certain vaccines, screening for sexually transmitted diseases, bone density testing, colonoscopy, mammograms, etc.  ° °MAMMOGRAMS:  All women over 40 years old should have a yearly mammogram. Many facilities now offer a "3D" mammogram, which may cost around $50 extra out of pocket. If possible,  we recommend you accept the option to have the 3D mammogram performed.  It both reduces the number of women who will be called back for extra views which then turn out to be normal, and it is better than the routine mammogram at detecting truly abnormal areas.   ° °COLON CANCER SCREENING: Now recommend starting at age 45. At this time colonoscopy is not covered for routine screening until 50. There are take home tests that can be done between 45-49.  ° °COLONOSCOPY:  Colonoscopy to screen for colon cancer is recommended for all women at age 50.  We know, you hate the idea of the prep.  We agree, BUT, having colon cancer and not knowing it is worse!!  Colon cancer so often starts as a polyp that can be seen and removed at colonscopy, which can quite literally save your life!  And if your first colonoscopy is normal and you have no family history of colon cancer, most women don't have to have it again for   10 years.  Once every ten years, you can do something that may end up saving your life, right?  We will be happy to help you get it scheduled when you are ready.  Be sure to check your insurance coverage so you understand how much it will cost.  It may be covered as a preventative service at no cost, but you should check your particular policy.   ° ° ° °Breast Self-Awareness °Breast self-awareness means being familiar with how your breasts look and feel. It involves checking your breasts regularly and reporting any changes to your  health care provider. °Practicing breast self-awareness is important. A change in your breasts can be a sign of a serious medical problem. Being familiar with how your breasts look and feel allows you to find any problems early, when treatment is more likely to be successful. All women should practice breast self-awareness, including women who have had breast implants. °How to do a breast self-exam °One way to learn what is normal for your breasts and whether your breasts are changing is to do a breast self-exam. To do a breast self-exam: °Look for Changes ° °1. Remove all the clothing above your waist. °2. Stand in front of a mirror in a room with good lighting. °3. Put your hands on your hips. °4. Push your hands firmly downward. °5. Compare your breasts in the mirror. Look for differences between them (asymmetry), such as: °? Differences in shape. °? Differences in size. °? Puckers, dips, and bumps in one breast and not the other. °6. Look at each breast for changes in your skin, such as: °? Redness. °? Scaly areas. °7. Look for changes in your nipples, such as: °? Discharge. °? Bleeding. °? Dimpling. °? Redness. °? A change in position. °Feel for Changes °Carefully feel your breasts for lumps and changes. It is best to do this while lying on your back on the floor and again while sitting or standing in the shower or tub with soapy water on your skin. Feel each breast in the following way: °· Place the arm on the side of the breast you are examining above your head. °· Feel your breast with the other hand. °· Start in the nipple area and make ¾ inch (2 cm) overlapping circles to feel your breast. Use the pads of your three middle fingers to do this. Apply light pressure, then medium pressure, then firm pressure. The light pressure will allow you to feel the tissue closest to the skin. The medium pressure will allow you to feel the tissue that is a little deeper. The firm pressure will allow you to feel the tissue  close to the ribs. °· Continue the overlapping circles, moving downward over the breast until you feel your ribs below your breast. °· Move one finger-width toward the center of the body. Continue to use the ¾ inch (2 cm) overlapping circles to feel your breast as you move slowly up toward your collarbone. °· Continue the up and down exam using all three pressures until you reach your armpit. ° °Write Down What You Find ° °Write down what is normal for each breast and any changes that you find. Keep a written record with breast changes or normal findings for each breast. By writing this information down, you do not need to depend only on memory for size, tenderness, or location. Write down where you are in your menstrual cycle, if you are still menstruating. °If you are having trouble noticing differences   in your breasts, do not get discouraged. With time you will become more familiar with the variations in your breasts and more comfortable with the exam. How often should I examine my breasts? Examine your breasts every month. If you are breastfeeding, the best time to examine your breasts is after a feeding or after using a breast pump. If you menstruate, the best time to examine your breasts is 5-7 days after your period is over. During your period, your breasts are lumpier, and it may be more difficult to notice changes. When should I see my health care provider? See your health care provider if you notice:  A change in shape or size of your breasts or nipples.  A change in the skin of your breast or nipples, such as a reddened or scaly area.  Unusual discharge from your nipples.  A lump or thick area that was not there before.  Pain in your breasts.  Anything that concerns you.  About Rectocele  Overview  A rectocele is a type of hernia which causes different degrees of bulging of the rectal tissues into the vaginal wall.  You may even notice that it presses against the vaginal wall so much  that some vaginal tissues droop outside of the opening of your vagina.  Causes of Rectocele  The most common cause is childbirth.  The muscles and ligaments in the pelvis that hold up and support the female organs and vagina become stretched and weakened during labor and delivery.  The more babies you have, the more the support tissues are stretched and weakened.  Not everyone who has a baby will develop a rectocele.  Some women have stronger supporting tissue in the pelvis and may not have as much of a problem as others.  Women who have a Cesarean section usually do not get rectocele's unless they pushed a long time prior to the cesarean delivery.  Other conditions that can cause a rectocele include chronic constipation, a chronic cough, a lot of heavy lifting, and obesity.  Older women may have this problem because the loss of female hormones causes the vaginal tissue to become weaker.  Symptoms  There may not be any symptoms.  If you do have symptoms, they may include:  Pelvic pressure in the rectal area  Protrusion of the lower part of the vagina through the opening of the vagina  Constipation and trapping of the stool, making it difficult to have a bowel movement.  In severe cases, you may have to press on the lower part of your vagina to help push the stool out of you rectum.  This is called splinting to empty.  Diagnosing Rectocele  Your health care provider will ask about your symptoms and perform a pelvic exam.  S/he will ask you to bear down, pushing like you are having a bowel movement so as to see how far the lower part of the vagina protrudes into the vagina and possible outside of the vagina.  Your provider will also ask you to contract the muscles of your pelvis (like you are stopping the stream in the middle of urinating) to determine the strength of your pelvic muscles.  Your provider may also do a rectal exam.  Treatment Options  If you do not have any symptoms, no treatment  may be necessary.  Other treatment options include:  Pelvic floor exercises: Contracting the muscles in your genital area may help strengthen your muscles and support the organs.  Be sure to get  proper exercise instruction from you physical therapist.  A pessary (removealbe pelvic support device) sometimes helps rectocele symptoms.  Surgery: Surgical repair may be necessary. In some cases the uterus may need to be taken out ( a hysterectomy) as well.  There are many types of surgery for pelvic support problems.  Look for physicians who specialize in repair procedures.  You can take care of yourself by:  Treating and preventing constipation  Avoiding heavy lifting, and lifting correctly (with your legs, not with you waist or back)  Treating a chronic cough or bronchitis  Not smoking  avoiding too much weight gain  Doing pelvic floor exercises   2007, [employer] Doc.33 Cystocele Repair  Cystocele repair is surgery to fix a cystocele, which is a bulging, drooping area (hernia) of the bladder that extends into the vagina. This bulging occurs on the top front (anterior) wall of the vagina. The condition may also be called an anterior prolapse or a prolapsed bladder. Tell a health care provider about:  Any allergies you have.  All medicines you are taking, including vitamins, herbs, eye drops, creams, and over-the-counter medicines.  Any problems you or family members have had with anesthetic medicines.  Any blood disorders you have.  Any surgeries you have had.  Any medical conditions you have.  Whether you are pregnant or may be pregnant. What are the risks? Generally, this is a safe procedure. However, problems may occur, including:  Infection.  Too much bleeding.  Allergic reactions to medicines.  Damage to other structures or organs.  Problems with the urinary drainage tube (catheter), such as blockage.  Return of the cystocele.  Problems with the  vaginal mesh. What happens before the procedure? Staying hydrated Follow instructions from your health care provider about hydration, which may include:  Up to 2 hours before the procedure - you may continue to drink clear liquids, such as water, clear fruit juice, black coffee, and plain tea. Eating and drinking restrictions Follow instructions from your health care provider about eating and drinking, which may include:  8 hours before the procedure - stop eating heavy meals or foods such as meat, fried foods, or fatty foods.  6 hours before the procedure - stop eating light meals or foods, such as toast or cereal.  6 hours before the procedure - stop drinking milk or drinks that contain milk.  2 hours before the procedure - stop drinking clear liquids. Medicines  Ask your health care provider about: ? Changing or stopping your regular medicines. This is especially important if you are taking diabetes medicines or blood thinners. ? Taking medicines such as aspirin and ibuprofen. These medicines can thin your blood. Do not take these medicines before your procedure if your health care provider instructs you not to.  You may be given antibiotic medicine to help prevent infection. General instructions  Do not use any products that contain nicotine or tobacco--such as cigarettes and e-cigarettes--for at least 2 weeks before the procedure. If you need help quitting, ask your health care provider.  Do not drink any alcohol for 3 days before the surgery.  Plan to have someone take you home from the hospital or clinic.  Ask your health care provider how your surgical site will be marked or identified. What happens during the procedure?  To reduce your risk of infection: ? Your health care team will wash or sanitize their hands. ? Your skin will be washed with soap.  You will be given one or  more of the following: ? A medicine to make you fall asleep (general anesthetic). ? A medicine  that is injected into your spine to numb the area below and slightly above the injection site (spinal anesthetic). ? A medicine that is injected into an area of your body to numb everything below the injection site (regional anesthetic).  A thin, flexible tube (indwelling urinary catheter) will be placed in your bladder to drain urine during and after the surgery.  The surgery will be performed through the vagina. An incision will be made in the front wall of the vagina.  The muscle between the bladder and vagina will be pulled up to its normal position. Stitches (sutures) or a piece of mesh will be used to support the muscle and hold it in place. This will remove the hernia so the top of the bladder does not fall into the opening of the vagina.  The incision on the front wall of the vagina will then be closed with stitches that will dissolve safely into your body and do not need to be removed. The procedure may vary among health care providers and hospitals. What happens after the procedure?  Your blood pressure, heart rate, breathing rate, and blood oxygen level will be monitored until the medicines you were given have worn off.  You will have a catheter in place to drain your bladder. This will stay in place for 2-7 days or until your bladder is working well on its own.  You may have gauze packing in the vagina. This will be removed 1-2 days after the surgery.  You will be given pain medicine as needed.  You may be given antibiotics to fight infection. This information is not intended to replace advice given to you by your health care provider. Make sure you discuss any questions you have with your health care provider. Document Released: 03/09/2000 Document Revised: 10/14/2015 Document Reviewed: 06/02/2015 Elsevier Interactive Patient Education  2019 [employer]

## 2018-10-10 DIAGNOSIS — Z1231 Encounter for screening mammogram for malignant neoplasm of breast: Secondary | ICD-10-CM | POA: Diagnosis not present

## 2018-10-22 DIAGNOSIS — M94262 Chondromalacia, left knee: Secondary | ICD-10-CM | POA: Diagnosis not present

## 2018-10-22 DIAGNOSIS — I10 Essential (primary) hypertension: Secondary | ICD-10-CM | POA: Diagnosis not present

## 2018-10-22 DIAGNOSIS — K219 Gastro-esophageal reflux disease without esophagitis: Secondary | ICD-10-CM | POA: Diagnosis not present

## 2018-10-22 DIAGNOSIS — Z0000 Encounter for general adult medical examination without abnormal findings: Secondary | ICD-10-CM | POA: Diagnosis not present

## 2018-10-22 DIAGNOSIS — E7800 Pure hypercholesterolemia, unspecified: Secondary | ICD-10-CM | POA: Diagnosis not present

## 2018-10-29 DIAGNOSIS — R05 Cough: Secondary | ICD-10-CM | POA: Diagnosis not present

## 2018-10-29 DIAGNOSIS — R6883 Chills (without fever): Secondary | ICD-10-CM | POA: Diagnosis not present

## 2018-10-29 DIAGNOSIS — J029 Acute pharyngitis, unspecified: Secondary | ICD-10-CM | POA: Diagnosis not present

## 2018-10-29 DIAGNOSIS — Z20828 Contact with and (suspected) exposure to other viral communicable diseases: Secondary | ICD-10-CM | POA: Diagnosis not present

## 2018-11-11 DIAGNOSIS — I10 Essential (primary) hypertension: Secondary | ICD-10-CM | POA: Diagnosis not present

## 2018-11-11 DIAGNOSIS — Z23 Encounter for immunization: Secondary | ICD-10-CM | POA: Diagnosis not present

## 2018-11-11 DIAGNOSIS — E7800 Pure hypercholesterolemia, unspecified: Secondary | ICD-10-CM | POA: Diagnosis not present

## 2018-11-24 DIAGNOSIS — M19041 Primary osteoarthritis, right hand: Secondary | ICD-10-CM | POA: Insufficient documentation

## 2018-11-24 DIAGNOSIS — M25531 Pain in right wrist: Secondary | ICD-10-CM | POA: Diagnosis not present

## 2018-11-24 DIAGNOSIS — R2231 Localized swelling, mass and lump, right upper limb: Secondary | ICD-10-CM | POA: Diagnosis not present

## 2018-11-24 DIAGNOSIS — M13841 Other specified arthritis, right hand: Secondary | ICD-10-CM | POA: Diagnosis not present

## 2018-12-09 ENCOUNTER — Other Ambulatory Visit: Payer: Self-pay | Admitting: Orthopedic Surgery

## 2018-12-09 DIAGNOSIS — D481 Neoplasm of uncertain behavior of connective and other soft tissue: Secondary | ICD-10-CM | POA: Diagnosis not present

## 2018-12-09 DIAGNOSIS — M71331 Other bursal cyst, right wrist: Secondary | ICD-10-CM | POA: Diagnosis not present

## 2018-12-09 DIAGNOSIS — M659 Synovitis and tenosynovitis, unspecified: Secondary | ICD-10-CM | POA: Diagnosis not present

## 2018-12-22 DIAGNOSIS — M65831 Other synovitis and tenosynovitis, right forearm: Secondary | ICD-10-CM | POA: Diagnosis not present

## 2019-02-06 DIAGNOSIS — Z96653 Presence of artificial knee joint, bilateral: Secondary | ICD-10-CM | POA: Diagnosis not present

## 2019-02-06 DIAGNOSIS — Z96651 Presence of right artificial knee joint: Secondary | ICD-10-CM | POA: Diagnosis not present

## 2019-02-06 DIAGNOSIS — Z471 Aftercare following joint replacement surgery: Secondary | ICD-10-CM | POA: Diagnosis not present

## 2019-02-16 DIAGNOSIS — M9901 Segmental and somatic dysfunction of cervical region: Secondary | ICD-10-CM | POA: Diagnosis not present

## 2019-02-16 DIAGNOSIS — S138XXA Sprain of joints and ligaments of other parts of neck, initial encounter: Secondary | ICD-10-CM | POA: Diagnosis not present

## 2019-02-17 DIAGNOSIS — S138XXA Sprain of joints and ligaments of other parts of neck, initial encounter: Secondary | ICD-10-CM | POA: Diagnosis not present

## 2019-02-17 DIAGNOSIS — M9901 Segmental and somatic dysfunction of cervical region: Secondary | ICD-10-CM | POA: Diagnosis not present

## 2019-02-18 DIAGNOSIS — M9901 Segmental and somatic dysfunction of cervical region: Secondary | ICD-10-CM | POA: Diagnosis not present

## 2019-02-18 DIAGNOSIS — S138XXA Sprain of joints and ligaments of other parts of neck, initial encounter: Secondary | ICD-10-CM | POA: Diagnosis not present

## 2019-02-23 DIAGNOSIS — S138XXA Sprain of joints and ligaments of other parts of neck, initial encounter: Secondary | ICD-10-CM | POA: Diagnosis not present

## 2019-02-23 DIAGNOSIS — M9901 Segmental and somatic dysfunction of cervical region: Secondary | ICD-10-CM | POA: Diagnosis not present

## 2019-02-24 DIAGNOSIS — S138XXA Sprain of joints and ligaments of other parts of neck, initial encounter: Secondary | ICD-10-CM | POA: Diagnosis not present

## 2019-02-24 DIAGNOSIS — M9901 Segmental and somatic dysfunction of cervical region: Secondary | ICD-10-CM | POA: Diagnosis not present

## 2019-02-25 DIAGNOSIS — M9901 Segmental and somatic dysfunction of cervical region: Secondary | ICD-10-CM | POA: Diagnosis not present

## 2019-02-25 DIAGNOSIS — S138XXA Sprain of joints and ligaments of other parts of neck, initial encounter: Secondary | ICD-10-CM | POA: Diagnosis not present

## 2019-03-02 DIAGNOSIS — S138XXA Sprain of joints and ligaments of other parts of neck, initial encounter: Secondary | ICD-10-CM | POA: Diagnosis not present

## 2019-03-02 DIAGNOSIS — M9901 Segmental and somatic dysfunction of cervical region: Secondary | ICD-10-CM | POA: Diagnosis not present

## 2019-03-04 DIAGNOSIS — S138XXA Sprain of joints and ligaments of other parts of neck, initial encounter: Secondary | ICD-10-CM | POA: Diagnosis not present

## 2019-03-04 DIAGNOSIS — M9901 Segmental and somatic dysfunction of cervical region: Secondary | ICD-10-CM | POA: Diagnosis not present

## 2019-03-09 DIAGNOSIS — M9901 Segmental and somatic dysfunction of cervical region: Secondary | ICD-10-CM | POA: Diagnosis not present

## 2019-03-09 DIAGNOSIS — S138XXA Sprain of joints and ligaments of other parts of neck, initial encounter: Secondary | ICD-10-CM | POA: Diagnosis not present

## 2019-03-10 DIAGNOSIS — S138XXA Sprain of joints and ligaments of other parts of neck, initial encounter: Secondary | ICD-10-CM | POA: Diagnosis not present

## 2019-03-10 DIAGNOSIS — M9901 Segmental and somatic dysfunction of cervical region: Secondary | ICD-10-CM | POA: Diagnosis not present

## 2019-03-16 DIAGNOSIS — S138XXA Sprain of joints and ligaments of other parts of neck, initial encounter: Secondary | ICD-10-CM | POA: Diagnosis not present

## 2019-03-16 DIAGNOSIS — M9901 Segmental and somatic dysfunction of cervical region: Secondary | ICD-10-CM | POA: Diagnosis not present

## 2019-04-18 ENCOUNTER — Other Ambulatory Visit: Payer: Self-pay

## 2019-04-18 ENCOUNTER — Ambulatory Visit: Payer: BC Managed Care – PPO | Attending: Internal Medicine

## 2019-04-18 DIAGNOSIS — Z23 Encounter for immunization: Secondary | ICD-10-CM | POA: Insufficient documentation

## 2019-05-09 ENCOUNTER — Ambulatory Visit: Payer: BC Managed Care – PPO | Attending: Internal Medicine

## 2019-05-09 DIAGNOSIS — Z23 Encounter for immunization: Secondary | ICD-10-CM | POA: Insufficient documentation

## 2019-05-09 NOTE — Progress Notes (Signed)
   Covid-19 Vaccination Clinic  Name:  Lindsay Thompson    MRN: [id number] DOB: September 14, 1950  05/09/2019  [name] was observed post Covid-19 immunization for 15 minutes without incidence. She was provided with Vaccine Information Sheet and instruction to access the V-Safe system.   [name] was instructed to call 911 with any severe reactions post vaccine: Marland Kitchen Difficulty breathing  . Swelling of your face and throat  . A fast heartbeat  . A bad rash all over your body  . Dizziness and weakness    Immunizations Administered    Name Date Dose VIS Date Route   Pfizer COVID-19 Vaccine 05/09/2019 12:13 PM 0.3 mL 03/06/2019 Intramuscular   Manufacturer: [employer], [employer]   Lot: [id number]   NDC: [address]-8

## 2019-06-23 ENCOUNTER — Telehealth: Payer: Self-pay | Admitting: Obstetrics and Gynecology

## 2019-06-23 NOTE — Telephone Encounter (Signed)
Patient is experiencing frequent urination and burning.

## 2019-06-23 NOTE — Telephone Encounter (Signed)
Spoke to pt. Pt states having frequency and burning for past 2 weeks. Pt had yeast infection x 1 month ago and got abx through PCP.   Pt states has hx of  prolapsed bladder. Pt has used AZO and has not helped. Pt also tried Monistat OTC and was burning but then got better. Pt now states urgency and frequency back and would like to be seen. Pt scheduled for OV with Dr Oscar La on 06/25/2019 at 4:30 pm. Pt agreeable and verbalized understanding. CPS neg.  Routing to Dr Oscar La for review and will close encounter.

## 2019-06-23 NOTE — Telephone Encounter (Signed)
Spoke back with pt. Pt rescheduled for 06/24/2019 at 1100 am. Pt agreeable.   Routing to Dr Oscar La for review. Encounter closed.

## 2019-06-24 ENCOUNTER — Encounter: Payer: Self-pay | Admitting: Obstetrics and Gynecology

## 2019-06-24 ENCOUNTER — Ambulatory Visit (INDEPENDENT_AMBULATORY_CARE_PROVIDER_SITE_OTHER): Payer: BC Managed Care – PPO | Admitting: Obstetrics and Gynecology

## 2019-06-24 ENCOUNTER — Other Ambulatory Visit: Payer: Self-pay

## 2019-06-24 VITALS — BP 118/68 | HR 107 | Temp 97.0°F | Ht 67.0 in | Wt 217.0 lb

## 2019-06-24 DIAGNOSIS — N949 Unspecified condition associated with female genital organs and menstrual cycle: Secondary | ICD-10-CM | POA: Diagnosis not present

## 2019-06-24 DIAGNOSIS — N3090 Cystitis, unspecified without hematuria: Secondary | ICD-10-CM | POA: Diagnosis not present

## 2019-06-24 DIAGNOSIS — N8111 Cystocele, midline: Secondary | ICD-10-CM | POA: Diagnosis not present

## 2019-06-24 DIAGNOSIS — N9489 Other specified conditions associated with female genital organs and menstrual cycle: Secondary | ICD-10-CM

## 2019-06-24 LAB — POCT URINALYSIS DIPSTICK
Bilirubin, UA: NEGATIVE
Blood, UA: NEGATIVE
Glucose, UA: NEGATIVE
Ketones, UA: NEGATIVE
Nitrite, UA: POSITIVE
Protein, UA: NEGATIVE
Urobilinogen, UA: NEGATIVE E.U./dL — AB
pH, UA: 5 (ref 5.0–8.0)

## 2019-06-24 MED ORDER — SULFAMETHOXAZOLE-TRIMETHOPRIM 800-160 MG PO TABS: 1.0000 | ORAL_TABLET | Freq: Two times a day (BID) | ORAL | 0 refills | Status: DC

## 2019-06-24 MED ORDER — PHENAZOPYRIDINE HCL 200 MG PO TABS: 200.0000 mg | ORAL_TABLET | Freq: Three times a day (TID) | ORAL | 0 refills | Status: DC | PRN

## 2019-06-24 NOTE — Patient Instructions (Addendum)
You can use Replens vaginal moisturizer or Coconut oil for vaginal dryness/burning. You can use Vaseline externally as needed.    Atrophic Vaginitis  Atrophic vaginitis is a condition in which the tissues that line the vagina become dry and thin. This condition is most common in women who have stopped having regular menstrual periods (are in menopause). This usually starts when a woman is 7-67 years old. That is the time when a woman's estrogen levels begin to drop (decrease). Estrogen is a female hormone. It helps to keep the tissues of the vagina moist. It stimulates the vagina to produce a clear fluid that lubricates the vagina for sexual intercourse. This fluid also protects the vagina from infection. Lack of estrogen can cause the lining of the vagina to get thinner and dryer. The vagina may also shrink in size. It may become less elastic. Atrophic vaginitis tends to get worse over time as a woman's estrogen level drops. What are the causes? This condition is caused by the normal drop in estrogen that happens around the time of menopause. What increases the risk? Certain conditions or situations may lower a woman's estrogen level, leading to a higher risk for atrophic vaginitis. You are more likely to develop this condition if:  You are taking medicines that block estrogen.  You have had your ovaries removed.  You are being treated for cancer with X-ray (radiation) or medicines (chemotherapy).  You have given birth or are breastfeeding.  You are older than age 58.  You smoke. What are the signs or symptoms? Symptoms of this condition include:  Pain, soreness, or bleeding during sexual intercourse (dyspareunia).  Vaginal burning, irritation, or itching.  Pain or bleeding when a speculum is used in a vaginal exam (pelvic exam).  Having burning pain when passing urine.  Vaginal discharge that is brown or yellow. In some cases, there are no symptoms. How is this diagnosed? This  condition is diagnosed by taking a medical history and doing a physical exam. This will include a pelvic exam that checks the vaginal tissues. Though rare, you may also have other tests, including:  A urine test.  A test that checks the acid balance in your vagina (acid balance test). How is this treated? Treatment for this condition depends on how severe your symptoms are. Treatment may include:  Using an over-the-counter vaginal lubricant before sex.  Using a long-acting vaginal moisturizer.  Using low-dose vaginal estrogen for moderate to severe symptoms that do not respond to other treatments. Options include creams, tablets, and inserts (vaginal rings). Before you use a vaginal estrogen, tell your health care provider if you have a history of: ? Breast cancer. ? Endometrial cancer. ? Blood clots. If you are not sexually active and your symptoms are very mild, you may not need treatment. Follow these instructions at home: Medicines  Take over-the-counter and prescription medicines only as told by your health care provider. Do not use herbal or alternative medicines unless your health care provider says that you can.  Use over-the-counter creams, lubricants, or moisturizers for dryness only as directed by your health care provider. General instructions  If your atrophic vaginitis is caused by menopause, discuss all of your menopause symptoms and treatment options with your health care provider.  Do not douche.  Do not use products that can make your vagina dry. These include: ? Scented feminine sprays. ? Scented tampons. ? Scented soaps.  Vaginal intercourse can help to improve blood flow and elasticity of vaginal tissue. If  it hurts to have sex, try using a lubricant or moisturizer just before having intercourse. Contact a health care provider if:  Your discharge looks different than normal.  Your vagina has an unusual smell.  You have new symptoms.  Your symptoms do  not improve with treatment.  Your symptoms get worse. Summary  Atrophic vaginitis is a condition in which the tissues that line the vagina become dry and thin. It is most common in women who have stopped having regular menstrual periods (are in menopause).  Treatment options include using vaginal lubricants and low-dose vaginal estrogen.  Contact a health care provider if your vagina has an unusual smell, or if your symptoms get worse or do not improve after treatment. This information is not intended to replace advice given to you by your health care provider. Make sure you discuss any questions you have with your health care provider. Document Revised: 02/22/2017 Document Reviewed: 12/06/2016 Elsevier Patient Education  2020 Elsevier Inc.   About Cystocele  Overview  The pelvic organs, including the bladder, are normally supported by pelvic floor muscles and ligaments.  When these muscles and ligaments are stretched, weakened or torn, the wall between the bladder and the vagina sags or herniates causing a prolapse, sometimes called a cystocele.  This condition may cause discomfort and problems with emptying the bladder.  It can be present in various stages.  Some people are not aware of the changes.  Others may notice changes at the vaginal opening or a feeling of the bladder dropping outside the body.  Causes of a Cystocele  A cystocele is usually caused by muscle straining or stretching during childbirth.  In addition, cystocele is more common after menopause, because the hormone estrogen helps keep the elastic tissues around the pelvic organs strong.  A cystocele is more likely to occur when levels of estrogen decrease.  Other causes include: heavy lifting, chronic coughing, previous pelvic surgery and obesity.  Symptoms  A bladder that has dropped from its normal position may cause: unwanted urine leakage (stress incontinence), frequent urination or urge to urinate, incomplete  emptying of the bladder (not feeling bladder relief after emptying), pain or discomfort in the vagina, pelvis, groin, lower back or lower abdomen and frequent urinary tract infections.  Mild cases may not cause any symptoms.  Treatment Options  Pelvic floor (Kegel) exercises:  Strength training the muscles in your genital area  Behavioral changes: Treating and preventing constipation, taking time to empty your bladder properly, learning to lift properly and/or avoid heavy lifting when possible, stopping smoking, avoiding weight gain and treating a chronic cough or bronchitis.  A pessary: A vaginal support device is sometimes used to help pelvic support caused by muscle and ligament changes.  Surgery: Surgical repair may be necessary if symptoms cannot be managed with exercise, behavioral changes and a pessary.  Surgery is usually considered for severe cases.   2007, [employer]  Urinary Tract Infection, Adult  A urinary tract infection (UTI) is an infection of any part of the urinary tract. The urinary tract includes the kidneys, ureters, bladder, and urethra. These organs make, store, and get rid of urine in the body. Your health care provider may use other names to describe the infection. An upper UTI affects the ureters and kidneys (pyelonephritis). A lower UTI affects the bladder (cystitis) and urethra (urethritis). What are the causes? Most urinary tract infections are caused by bacteria in your genital area, around the entrance to your urinary tract (urethra). These  bacteria grow and cause inflammation of your urinary tract. What increases the risk? You are more likely to develop this condition if:  You have a urinary catheter that stays in place (indwelling).  You are not able to control when you urinate or have a bowel movement (you have incontinence).  You are female and you: ? Use a spermicide or diaphragm for birth control. ? Have low estrogen levels. ? Are  pregnant.  You have certain genes that increase your risk (genetics).  You are sexually active.  You take antibiotic medicines.  You have a condition that causes your flow of urine to slow down, such as: ? An enlarged prostate, if you are female. ? Blockage in your urethra (stricture). ? A kidney stone. ? A nerve condition that affects your bladder control (neurogenic bladder). ? Not getting enough to drink, or not urinating often.  You have certain medical conditions, such as: ? Diabetes. ? A weak disease-fighting system (immunesystem). ? Sickle cell disease. ? Gout. ? Spinal cord injury. What are the signs or symptoms? Symptoms of this condition include:  Needing to urinate right away (urgently).  Frequent urination or passing small amounts of urine frequently.  Pain or burning with urination.  Blood in the urine.  Urine that smells bad or unusual.  Trouble urinating.  Cloudy urine.  Vaginal discharge, if you are female.  Pain in the abdomen or the lower back. You may also have:  Vomiting or a decreased appetite.  Confusion.  Irritability or tiredness.  A fever.  Diarrhea. The first symptom in older adults may be confusion. In some cases, they may not have any symptoms until the infection has worsened. How is this diagnosed? This condition is diagnosed based on your medical history and a physical exam. You may also have other tests, including:  Urine tests.  Blood tests.  Tests for sexually transmitted infections (STIs). If you have had more than one UTI, a cystoscopy or imaging studies may be done to determine the cause of the infections. How is this treated? Treatment for this condition includes:  Antibiotic medicine.  Over-the-counter medicines to treat discomfort.  Drinking enough water to stay hydrated. If you have frequent infections or have other conditions such as a kidney stone, you may need to see a health care provider who specializes  in the urinary tract (urologist). In rare cases, urinary tract infections can cause sepsis. Sepsis is a life-threatening condition that occurs when the body responds to an infection. Sepsis is treated in the hospital with IV antibiotics, fluids, and other medicines. Follow these instructions at home:  Medicines  Take over-the-counter and prescription medicines only as told by your health care provider.  If you were prescribed an antibiotic medicine, take it as told by your health care provider. Do not stop using the antibiotic even if you start to feel better. General instructions  Make sure you: ? Empty your bladder often and completely. Do not hold urine for long periods of time. ? Empty your bladder after sex. ? Wipe from front to back after a bowel movement if you are female. Use each tissue one time when you wipe.  Drink enough fluid to keep your urine pale yellow.  Keep all follow-up visits as told by your health care provider. This is important. Contact a health care provider if:  Your symptoms do not get better after 1-2 days.  Your symptoms go away and then return. Get help right away if you have:  Severe pain  in your back or your lower abdomen.  A fever.  Nausea or vomiting. Summary  A urinary tract infection (UTI) is an infection of any part of the urinary tract, which includes the kidneys, ureters, bladder, and urethra.  Most urinary tract infections are caused by bacteria in your genital area, around the entrance to your urinary tract (urethra).  Treatment for this condition often includes antibiotic medicines.  If you were prescribed an antibiotic medicine, take it as told by your health care provider. Do not stop using the antibiotic even if you start to feel better.  Keep all follow-up visits as told by your health care provider. This is important. This information is not intended to replace advice given to you by your health care provider. Make sure you  discuss any questions you have with your health care provider. Document Revised: 02/27/2018 Document Reviewed: 09/19/2017 Elsevier Patient Education  2020 [employer].

## 2019-06-24 NOTE — Progress Notes (Addendum)
GYNECOLOGY  VISIT   HPI: 69 y.o.   Married White or Caucasian Not Hispanic or Latino  female   [phone] with Patient's last menstrual period was 03/26/1988 (approximate).   here for Dysuria and frequency. She says that she its been going on for about a month. She took some cipro that she had. Took the cipro for 5 days. Symptoms resolved.  Symptoms restarted a week ago. She c/o severe dysuria at the urinary stream, urinary frequency and urgency. Just doesn't feel right. She tried monistat secondary to burning. She has slight burning even when she isn't voiding. No discharge, no itching, no odor.  No flank pain, no fever.  She has a known cystocele, she can feel the bulge with the burning, typically doesn't bother her. No leakage, fells like she empties okay.   GYNECOLOGIC HISTORY: Patient's last menstrual period was 03/26/1988 (approximate). Contraception:PMP Menopausal hormone therapy: none         OB History    Gravida  2   Para  2   Term  2   Preterm  0   AB  0   Living  2     SAB  0   TAB  0   Ectopic  0   Multiple  0   Live Births  2              Patient Active Problem List   Diagnosis Date Noted  . Rectocele 05/18/2013  . Cystocele, grade 2 05/18/2013  . Postmenopausal 05/18/2013    Past Medical History:  Diagnosis Date  . Bell's palsy 06/2002  . Cystocele, grade 2    high  . Hyperlipidemia   . Hypertension   . Ischemic colitis ([facility]) 10/08  . Rectocele    mod grade 2    Past Surgical History:  Procedure Laterality Date  . APPENDECTOMY  1974  . BACK SURGERY  4/96  . BREAST REDUCTION SURGERY Bilateral 9/02  . CERVICAL FUSION  1998   C5-C6  . CERVICAL SPINE SURGERY  01/25/11   cervical neck cage C2-4/5  . CHOLECYSTECTOMY, LAPAROSCOPIC  2/93  . LAMINECTOMY  12/95   L5-S1  . LAPAROSCOPIC ABDOMINAL EXPLORATION  11/00   LLQ pain, Dickstein  . LAPAROSCOPIC SALPINGO OOPHERECTOMY Right 1/98   Dickstein, endometriosis  . MENISCUS REPAIR Left  11/03/2015  . NECK SURGERY  10/03   plate in neck for herniated disc, 3/05 changed plate  . TONSILLECTOMY AND ADENOIDECTOMY  1972  . TOTAL VAGINAL HYSTERECTOMY  1990   pelvic relaxation, and endometriosis    Current Outpatient Medications  Medication Sig Dispense Refill  . aspirin EC 81 MG tablet Take 81 mg by mouth daily.    Marland Kitchen atorvastatin (LIPITOR) 10 MG tablet Take 10 mg by mouth daily.    . cholecalciferol (VITAMIN D) 25 MCG (1000 UT) tablet Take 1 tablet (1,000 Units total) by mouth daily.    . Multiple Vitamin (MULTIVITAMIN) tablet Take 1 tablet by mouth daily.    . Omega-3 Fatty Acids (FISH OIL) 1000 MG CAPS Take by mouth.    . telmisartan (MICARDIS) 40 MG tablet Take 40 mg by mouth daily.    Marland Kitchen triamterene-hydrochlorothiazide (DYAZIDE) 37.5-25 MG capsule Take 1 capsule by mouth daily.    Marland Kitchen omeprazole (PRILOSEC) 40 MG capsule Take 40 mg by mouth every morning.     No current facility-administered medications for this visit.     ALLERGIES: Ibuprofen-famotidine, Adhesive [tape], Erythromycin, and Other  Family History  Problem Relation  Age of Onset  . Colon polyps Father   . Nephrolithiasis Father   . Prostate cancer Father   . Hyperlipidemia Father   . Hypertension Father   . Bladder Cancer Father 90  . Osteoporosis Mother 56  . Heart disease Mother   . Hypertension Mother   . Hyperlipidemia Mother   . Hyperlipidemia Brother   . Hypertension Brother   . Prostate cancer Brother   . Pneumonia Maternal Grandmother   . Heart failure Maternal Grandfather   . Cancer Paternal Grandfather   . Hyperlipidemia Brother   . Hypertension Brother   . Stroke Maternal Aunt   . Heart failure Maternal Uncle   . Prostate cancer Brother 84  . Thyroid cancer Daughter     Social History   Socioeconomic History  . Marital status: Married    Spouse name: Not on file  . Number of children: 2  . Years of education: Not on file  . Highest education level: Not on file  Occupational  History    Employer: [employer]  Tobacco Use  . Smoking status: Never Smoker  . Smokeless tobacco: Never Used  Substance and Sexual Activity  . Alcohol use: Yes    Comment: occ wine  . Drug use: No  . Sexual activity: Yes    Partners: Male    Birth control/protection: Post-menopausal  Other Topics Concern  . Not on file  Social History Narrative  . Not on file   Social Determinants of Health   Financial Resource Strain:   . Difficulty of Paying Living Expenses:   Food Insecurity:   . Worried About Programme researcher, broadcasting/film/video in the Last Year:   . Barista in the Last Year:   Transportation Needs:   . Freight forwarder (Medical):   Marland Kitchen Lack of Transportation (Non-Medical):   Physical Activity:   . Days of Exercise per Week:   . Minutes of Exercise per Session:   Stress:   . Feeling of Stress :   Social Connections:   . Frequency of Communication with Friends and Family:   . Frequency of Social Gatherings with Friends and Family:   . Attends Religious Services:   . Active Member of Clubs or Organizations:   . Attends Banker Meetings:   Marland Kitchen Marital Status:   Intimate Partner Violence:   . Fear of Current or Ex-Partner:   . Emotionally Abused:   Marland Kitchen Physically Abused:   . Sexually Abused:     Review of Systems  Constitutional: Negative.   HENT: Negative.   Eyes: Negative.   Respiratory: Negative.   Cardiovascular: Negative.   Gastrointestinal: Negative.   Genitourinary: Positive for dysuria, frequency, hematuria and urgency.  Musculoskeletal: Negative.   Skin: Negative.   Neurological: Negative.   Endo/Heme/Allergies: Negative.   Psychiatric/Behavioral: Negative.     PHYSICAL EXAMINATION:    BP 118/68   Pulse (!) 107   Temp (!) 97 F (36.1 C)   Ht 5\' 7"  (1.702 m)   Wt 217 lb (98.4 kg)   LMP 03/26/1988 (Approximate)   SpO2 99%   BMI 33.99 kg/m     General appearance: alert, cooperative and appears stated age Abdomen: soft,  non-tender; non distended, no masses,  no organomegaly CVA: not tender  Pelvic: External genitalia:  no lesions              Urethra:  normal appearing urethra with no masses, tenderness or lesions  Bartholins and Skenes: normal                 Vagina: atrophic appearing vagina with a grade 2 cystocele, no discharge, and no lesions              Cervix: absent               Chaperone was present for exam.  Urine dip: +2 leuk, +nitrate  ASSESSMENT Cystitis Vaginal burning Vaginal atrophy Cystocele, discussed option of trial of pessary if bothersome    PLAN Send urine for ua, c&s Send nuswab vaginitis panel She can try Replens or coconut oil for vaginal burning Discussed vaginal atrophy, typically not bothersome Information on cystocele given Information on vulvar skin care reviewed   An After Visit Summary was printed and given to the patient.  Addendum: patient treated with bactrim and pyridium.

## 2019-06-25 ENCOUNTER — Ambulatory Visit: Payer: BC Managed Care – PPO | Admitting: Obstetrics and Gynecology

## 2019-06-25 LAB — URINALYSIS, MICROSCOPIC ONLY
Casts: NONE SEEN /lpf
Epithelial Cells (non renal): >10 /hpf — AB (ref 0–10)

## 2019-06-26 LAB — NUSWAB BV AND CANDIDA, NAA
Candida albicans, NAA: NEGATIVE
Candida glabrata, NAA: NEGATIVE

## 2019-06-27 LAB — URINE CULTURE

## 2019-09-02 ENCOUNTER — Other Ambulatory Visit: Payer: Self-pay

## 2019-09-02 NOTE — Progress Notes (Signed)
69 y.o. G40P2002 Married White or Caucasian Not Hispanic or Latino female here for annual exam.  H/O TVH/H/O RSO. Known cystocele and rectocele, not bothersome. No bladder or bowel c/o.     Patient's last menstrual period was 03/26/1988 (approximate).          Sexually active: Yes.    The current method of family planning is status post hysterectomy.    Exercising: Yes.    exercise bike 30 and squats  Smoker:  no  Health Maintenance: Pap:  04/10/00 WNL (TVH) History of abnormal Pap:  no MMG:10/2018 Normal Report requested 6/10   BMD:  02/02/2015:Normal, -0.60 Spine / +0.30 Right Femur Neck / -0.40 Left Femur Neck  Colonoscopy: 07/2017 WNL TDaP:  03/26/2009 Gardasil: NA   reports that she has never smoked. She has never used smokeless tobacco. She reports current alcohol use. She reports that she does not use drugs. 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 She is from [place], moved here over 30 years ago. 2 kids and 2 grand kids (all local).  Past Medical History:  Diagnosis Date  . Bell's palsy 06/2002  . Cystocele, grade 2    high  . Hyperlipidemia   . Hypertension   . Ischemic colitis ([place]) 10/08  . Rectocele    mod grade 2    Past Surgical History:  Procedure Laterality Date  . APPENDECTOMY  1974  . BACK SURGERY  4/96  . BREAST REDUCTION SURGERY Bilateral 9/02  . CERVICAL FUSION  1998   C5-C6  . CERVICAL SPINE SURGERY  01/25/11   cervical neck cage C2-4/5  . CHOLECYSTECTOMY, LAPAROSCOPIC  2/93  . LAMINECTOMY  12/95   L5-S1  . LAPAROSCOPIC ABDOMINAL EXPLORATION  11/00   LLQ pain, Dickstein  . LAPAROSCOPIC SALPINGO OOPHERECTOMY Right 1/98   Dickstein, endometriosis  . MENISCUS REPAIR Left 11/03/2015  . NECK SURGERY  10/03   plate in neck for herniated disc, 3/05 changed plate  . TONSILLECTOMY AND ADENOIDECTOMY  1972  . TOTAL VAGINAL HYSTERECTOMY  1990   pelvic relaxation, and endometriosis    Current Outpatient Medications   Medication Sig Dispense Refill  . aspirin EC 81 MG tablet Take 81 mg by mouth daily.    Marland Kitchen atorvastatin (LIPITOR) 10 MG tablet Take 10 mg by mouth daily.    . cholecalciferol (VITAMIN D) 25 MCG (1000 UT) tablet Take 1 tablet (1,000 Units total) by mouth daily.    . Multiple Vitamin (MULTIVITAMIN) tablet Take 1 tablet by mouth daily.    . Omega-3 Fatty Acids (FISH OIL) 1000 MG CAPS Take by mouth.    Marland Kitchen omeprazole (PRILOSEC) 40 MG capsule Take 40 mg by mouth every morning.    Marland Kitchen telmisartan (MICARDIS) 40 MG tablet Take 40 mg by mouth daily.    Marland Kitchen triamterene-hydrochlorothiazide (DYAZIDE) 37.5-25 MG capsule Take 1 capsule by mouth daily.     No current facility-administered medications for this visit.    Family History  Problem Relation Age of Onset  . Colon polyps Father   . Nephrolithiasis Father   . Prostate cancer Father   . Hyperlipidemia Father   . Hypertension Father   . Bladder Cancer Father 45  . Osteoporosis Mother 59  . Heart disease Mother   . Hypertension Mother   . Hyperlipidemia Mother   . Hyperlipidemia Brother   . Hypertension Brother   . Prostate cancer Brother   . Pneumonia Maternal Grandmother   . Heart failure Maternal Grandfather   .  Cancer Paternal Grandfather   . Hyperlipidemia Brother   . Hypertension Brother   . Stroke Maternal Aunt   . Heart failure Maternal Uncle   . Prostate cancer Brother 65  . Thyroid cancer Daughter     Review of Systems  Constitutional: Negative.   HENT: Negative.   Eyes: Negative.   Respiratory: Negative.   Cardiovascular: Negative.   Gastrointestinal: Negative.   Endocrine: Negative.   Genitourinary: Negative.   Musculoskeletal: Negative.   Skin: Negative.   Allergic/Immunologic: Negative.   Neurological: Negative.   Hematological: Negative.   Psychiatric/Behavioral: Negative.     Exam:   BP 122/68   Pulse 98   Temp 98.1 F (36.7 C)   Ht 5\' 6"  (1.676 m)   Wt 218 lb (98.9 kg)   LMP 03/26/1988 (Approximate)    SpO2 99%   BMI 35.19 kg/m   Weight change: @WEIGHTCHANGE @ Height:   Height: 5\' 6"  (167.6 cm)  Ht Readings from Last 3 Encounters:  09/03/19 5\' 6"  (1.676 m)  06/24/19 5\' 7"  (1.702 m)  08/26/18 5\' 7"  (1.702 m)    General appearance: alert, cooperative and appears stated age Head: Normocephalic, without obvious abnormality, atraumatic Neck: no adenopathy, supple, symmetrical, trachea midline and thyroid normal to inspection and palpation Lungs: clear to auscultation bilaterally Cardiovascular: regular rate and rhythm Breasts: normal appearance, no masses or tenderness Abdomen: soft, non-tender; non distended,  no masses,  no organomegaly Extremities: extremities normal, atraumatic, no cyanosis or edema Skin: Skin color, texture, turgor normal. No rashes or lesions Lymph nodes: Cervical, supraclavicular, and axillary nodes normal. No abnormal inguinal nodes palpated Neurologic: Grossly normal   Pelvic: External genitalia:  no lesions              Urethra:  normal appearing urethra with no masses, tenderness or lesions              Bartholins and Skenes: normal                 Vagina: atrophic appearing vagina with normal color and discharge, no lesions. Grade 2 cystocele, small grade 2 rectocele, grade 1 vault prolapse.               Cervix: absent               Bimanual Exam:  Uterus:  uterus absent              Adnexa: no mass, fullness, tenderness               Rectovaginal: Confirms               Anus:  normal sphincter tone, no lesions  11/03/19 chaperoned for the exam.  A:  Well Woman with normal exam  Asymptomatic genital prolapse    P:   Mammogram due in August  Labs with primary  Colonoscopy UTD  DEXA UTD  Discussed breast self exam  Discussed calcium and vit D intake

## 2019-09-03 ENCOUNTER — Ambulatory Visit (INDEPENDENT_AMBULATORY_CARE_PROVIDER_SITE_OTHER): Payer: BC Managed Care – PPO | Admitting: Obstetrics and Gynecology

## 2019-09-03 ENCOUNTER — Encounter: Payer: Self-pay | Admitting: Obstetrics and Gynecology

## 2019-09-03 VITALS — BP 122/68 | HR 98 | Temp 98.1°F | Ht 66.0 in | Wt 218.0 lb

## 2019-09-03 DIAGNOSIS — Z01419 Encounter for gynecological examination (general) (routine) without abnormal findings: Secondary | ICD-10-CM

## 2019-09-03 DIAGNOSIS — N816 Rectocele: Secondary | ICD-10-CM | POA: Diagnosis not present

## 2019-09-03 DIAGNOSIS — N8111 Cystocele, midline: Secondary | ICD-10-CM | POA: Diagnosis not present

## 2019-09-03 NOTE — Patient Instructions (Signed)
EXERCISE AND DIET:  We recommended that you start or continue a regular exercise program for good health. Regular exercise means any activity that makes your heart beat faster and makes you sweat.  We recommend exercising at least 30 minutes per day at least 3 days a week, preferably 4 or 5.  We also recommend a diet low in fat and sugar.  Inactivity, poor dietary choices and obesity can cause diabetes, heart attack, stroke, and kidney damage, among others.    ALCOHOL AND SMOKING:  Women should limit their alcohol intake to no more than 7 drinks/beers/glasses of wine (combined, not each!) per week. Moderation of alcohol intake to this level decreases your risk of breast cancer and liver damage. And of course, no recreational drugs are part of a healthy lifestyle.  And absolutely no smoking or even second hand smoke. Most people know smoking can cause heart and lung diseases, but did you know it also contributes to weakening of your bones? Aging of your skin?  Yellowing of your teeth and nails?  CALCIUM AND VITAMIN D:  Adequate intake of calcium and Vitamin D are recommended.  The recommendations for exact amounts of these supplements seem to change often, but generally speaking 1,200 mg of calcium (between diet and supplement) and 800 units of Vitamin D per day seems prudent. Certain women may benefit from higher intake of Vitamin D.  If you are among these women, your doctor will have told you during your visit.    PAP SMEARS:  Pap smears, to check for cervical cancer or precancers,  have traditionally been done yearly, although recent scientific advances have shown that most women can have pap smears less often.  However, every woman still should have a physical exam from her gynecologist every year. It will include a breast check, inspection of the vulva and vagina to check for abnormal growths or skin changes, a visual exam of the cervix, and then an exam to evaluate the size and shape of the uterus and  ovaries.  And after 67 years of age, a rectal exam is indicated to check for rectal cancers. We will also provide age appropriate advice regarding health maintenance, like when you should have certain vaccines, screening for sexually transmitted diseases, bone density testing, colonoscopy, mammograms, etc.   MAMMOGRAMS:  All women over 40 years old should have a yearly mammogram. Many facilities now offer a "3D" mammogram, which may cost around $50 extra out of pocket. If possible,  we recommend you accept the option to have the 3D mammogram performed.  It both reduces the number of women who will be called back for extra views which then turn out to be normal, and it is better than the routine mammogram at detecting truly abnormal areas.    COLON CANCER SCREENING: Now recommend starting at age 45. At this time colonoscopy is not covered for routine screening until 50. There are take home tests that can be done between 45-49.   COLONOSCOPY:  Colonoscopy to screen for colon cancer is recommended for all women at age 50.  We know, you hate the idea of the prep.  We agree, BUT, having colon cancer and not knowing it is worse!!  Colon cancer so often starts as a polyp that can be seen and removed at colonscopy, which can quite literally save your life!  And if your first colonoscopy is normal and you have no family history of colon cancer, most women don't have to have it again for   10 years.  Once every ten years, you can do something that may end up saving your life, right?  We will be happy to help you get it scheduled when you are ready.  Be sure to check your insurance coverage so you understand how much it will cost.  It may be covered as a preventative service at no cost, but you should check your particular policy.      Breast Self-Awareness Breast self-awareness means being familiar with how your breasts look and feel. It involves checking your breasts regularly and reporting any changes to your  health care provider. Practicing breast self-awareness is important. A change in your breasts can be a sign of a serious medical problem. Being familiar with how your breasts look and feel allows you to find any problems early, when treatment is more likely to be successful. All women should practice breast self-awareness, including women who have had breast implants. How to do a breast self-exam One way to learn what is normal for your breasts and whether your breasts are changing is to do a breast self-exam. To do a breast self-exam: Look for Changes  1. Remove all the clothing above your waist. 2. Stand in front of a mirror in a room with good lighting. 3. Put your hands on your hips. 4. Push your hands firmly downward. 5. Compare your breasts in the mirror. Look for differences between them (asymmetry), such as: ? Differences in shape. ? Differences in size. ? Puckers, dips, and bumps in one breast and not the other. 6. Look at each breast for changes in your skin, such as: ? Redness. ? Scaly areas. 7. Look for changes in your nipples, such as: ? Discharge. ? Bleeding. ? Dimpling. ? Redness. ? A change in position. Feel for Changes Carefully feel your breasts for lumps and changes. It is best to do this while lying on your back on the floor and again while sitting or standing in the shower or tub with soapy water on your skin. Feel each breast in the following way:  Place the arm on the side of the breast you are examining above your head.  Feel your breast with the other hand.  Start in the nipple area and make  inch (2 cm) overlapping circles to feel your breast. Use the pads of your three middle fingers to do this. Apply light pressure, then medium pressure, then firm pressure. The light pressure will allow you to feel the tissue closest to the skin. The medium pressure will allow you to feel the tissue that is a little deeper. The firm pressure will allow you to feel the tissue  close to the ribs.  Continue the overlapping circles, moving downward over the breast until you feel your ribs below your breast.  Move one finger-width toward the center of the body. Continue to use the  inch (2 cm) overlapping circles to feel your breast as you move slowly up toward your collarbone.  Continue the up and down exam using all three pressures until you reach your armpit.  Write Down What You Find  Write down what is normal for each breast and any changes that you find. Keep a written record with breast changes or normal findings for each breast. By writing this information down, you do not need to depend only on memory for size, tenderness, or location. Write down where you are in your menstrual cycle, if you are still menstruating. If you are having trouble noticing differences   in your breasts, do not get discouraged. With time you will become more familiar with the variations in your breasts and more comfortable with the exam. How often should I examine my breasts? Examine your breasts every month. If you are breastfeeding, the best time to examine your breasts is after a feeding or after using a breast pump. If you menstruate, the best time to examine your breasts is 5-7 days after your period is over. During your period, your breasts are lumpier, and it may be more difficult to notice changes. When should I see my health care provider? See your health care provider if you notice:  A change in shape or size of your breasts or nipples.  A change in the skin of your breast or nipples, such as a reddened or scaly area.  Unusual discharge from your nipples.  A lump or thick area that was not there before.  Pain in your breasts.  Anything that concerns you.  

## 2019-09-16 DIAGNOSIS — M25561 Pain in right knee: Secondary | ICD-10-CM | POA: Diagnosis not present

## 2019-10-04 ENCOUNTER — Encounter: Payer: Self-pay | Admitting: Obstetrics and Gynecology

## 2019-10-05 ENCOUNTER — Telehealth: Payer: Self-pay | Admitting: Obstetrics and Gynecology

## 2019-10-05 NOTE — Telephone Encounter (Signed)
Lindsay Thompson, Lindsay Thompson Clinical Pool Dr Venida Jarvis,   I just got notified on Friday that I may have Lindsay Thompson Duty on December 08, 2019. I've always kept my obligation of going for jury duty but this year feel very uncomfortable doing so. I get these spur of the moment urges to get to the bathroom and have actually just made it before having an accident. I feel fine then the next thing I know I have to go so bad and rush to a bathroom. I don't have incontinence but  can say it will leak a bit just before getting to the toilet. I know when you're on a jury in the courtroom they don't allow you to go. I fear sitting there and getting that urge and not be allowed to go.  Would you be willing to write me a letter excusing me of jury duty. I would greatly appreciate it.   Sincerely  Lindsay Thompson

## 2019-10-05 NOTE — Telephone Encounter (Signed)
Routing to Dr Oscar La for review and possible letter to excuse from [employer].  Please advise

## 2019-10-05 NOTE — Telephone Encounter (Signed)
Letter sent to patient via MyChart and to patient's home address on file. Encounter closed.

## 2019-10-05 NOTE — Telephone Encounter (Signed)
Please type up a letter for her 

## 2019-10-05 NOTE — Telephone Encounter (Signed)
Letter to Dr.Jertson for review. 

## 2019-10-07 NOTE — Telephone Encounter (Signed)
Please adjust the letter for her.

## 2019-10-12 DIAGNOSIS — Z1231 Encounter for screening mammogram for malignant neoplasm of breast: Secondary | ICD-10-CM | POA: Diagnosis not present

## 2019-10-21 DIAGNOSIS — I10 Essential (primary) hypertension: Secondary | ICD-10-CM | POA: Diagnosis not present

## 2019-10-21 DIAGNOSIS — E7800 Pure hypercholesterolemia, unspecified: Secondary | ICD-10-CM | POA: Diagnosis not present

## 2019-10-26 ENCOUNTER — Encounter: Payer: Self-pay | Admitting: Obstetrics and Gynecology

## 2019-10-26 DIAGNOSIS — I10 Essential (primary) hypertension: Secondary | ICD-10-CM | POA: Diagnosis not present

## 2019-10-26 DIAGNOSIS — E7800 Pure hypercholesterolemia, unspecified: Secondary | ICD-10-CM | POA: Diagnosis not present

## 2019-10-26 DIAGNOSIS — Z0000 Encounter for general adult medical examination without abnormal findings: Secondary | ICD-10-CM | POA: Diagnosis not present

## 2019-10-26 DIAGNOSIS — Z1389 Encounter for screening for other disorder: Secondary | ICD-10-CM | POA: Diagnosis not present

## 2019-10-26 DIAGNOSIS — M94262 Chondromalacia, left knee: Secondary | ICD-10-CM | POA: Diagnosis not present

## 2019-10-26 DIAGNOSIS — K219 Gastro-esophageal reflux disease without esophagitis: Secondary | ICD-10-CM | POA: Diagnosis not present

## 2019-10-26 DIAGNOSIS — Z23 Encounter for immunization: Secondary | ICD-10-CM | POA: Diagnosis not present

## 2019-11-13 DIAGNOSIS — M791 Myalgia, unspecified site: Secondary | ICD-10-CM | POA: Diagnosis not present

## 2019-11-17 DIAGNOSIS — N289 Disorder of kidney and ureter, unspecified: Secondary | ICD-10-CM | POA: Diagnosis not present

## 2019-11-24 DIAGNOSIS — R922 Inconclusive mammogram: Secondary | ICD-10-CM | POA: Diagnosis not present

## 2020-01-19 ENCOUNTER — Encounter: Payer: Self-pay | Admitting: Obstetrics and Gynecology

## 2020-01-19 ENCOUNTER — Telehealth: Payer: Self-pay | Admitting: *Deleted

## 2020-01-19 NOTE — Telephone Encounter (Signed)
Spoke with patient. Patient reports burning sensation after voiding and urinary frequency, voiding normal amounts. Symptoms started on 10/22. Denies vaginal odor, d/c, bleeding, flank pain, fever/chills, N/V. Requesting Rx. Advised OV needed for further evaluation, patient agreeable to schedule. OV scheduled for 10/27 at 11:15am with Dr. Oscar La.   Last AEX 09/03/19  Routing to provider for final review. Patient is agreeable to disposition. Will close encounter.

## 2020-01-19 NOTE — Telephone Encounter (Signed)
See 01/19/20 telephone encounter.   Encounter closed.

## 2020-01-19 NOTE — Telephone Encounter (Signed)
[facility] Clinical Pool Dr. Venida Jarvis   I am having symptoms of my atrophic vaginitis as I had back in [place] I love . Then you treated me with Bactrim and Pyridium which cleared it up. Was wondering if you can call me in the same to clear it up again. My drugstore is [facility] [place].   Thank you  Shea Evans

## 2020-01-20 ENCOUNTER — Ambulatory Visit (INDEPENDENT_AMBULATORY_CARE_PROVIDER_SITE_OTHER): Payer: Medicare Other | Admitting: Obstetrics and Gynecology

## 2020-01-20 ENCOUNTER — Other Ambulatory Visit: Payer: Self-pay

## 2020-01-20 ENCOUNTER — Encounter: Payer: Self-pay | Admitting: Obstetrics and Gynecology

## 2020-01-20 VITALS — BP 122/70 | HR 88 | Resp 16 | Ht 67.0 in | Wt 219.0 lb

## 2020-01-20 DIAGNOSIS — N3090 Cystitis, unspecified without hematuria: Secondary | ICD-10-CM | POA: Diagnosis not present

## 2020-01-20 LAB — POCT URINALYSIS DIPSTICK
Bilirubin, UA: NEGATIVE
Glucose, UA: NEGATIVE
Ketones, UA: NEGATIVE
Nitrite, UA: NEGATIVE
Protein, UA: POSITIVE — AB
Urobilinogen, UA: NEGATIVE E.U./dL — AB
pH, UA: 7 (ref 5.0–8.0)

## 2020-01-20 MED ORDER — SULFAMETHOXAZOLE-TRIMETHOPRIM 800-160 MG PO TABS: 1.0000 | ORAL_TABLET | Freq: Two times a day (BID) | ORAL | 0 refills | Status: DC

## 2020-01-20 MED ORDER — PHENAZOPYRIDINE HCL 200 MG PO TABS: 200.0000 mg | ORAL_TABLET | Freq: Three times a day (TID) | ORAL | 0 refills | Status: DC | PRN

## 2020-01-20 NOTE — Progress Notes (Signed)
GYNECOLOGY  VISIT   HPI: 69 y.o.   Married White or Caucasian Not Hispanic or Latino  female   [phone] with Patient's last menstrual period was 03/26/1988 (approximate).   here for a possible UTI. Patient c/o having urinary frequency, urgency, burning after urination since Friday, 10/22.     No fever, no flank pain. No vaginal c/o.   GYNECOLOGIC HISTORY: Patient's last menstrual period was 03/26/1988 (approximate). Contraception:Hysterectomy Menopausal hormone therapy: none        OB History    Gravida  2   Para  2   Term  2   Preterm  0   AB  0   Living  2     SAB  0   TAB  0   Ectopic  0   Multiple  0   Live Births  2              Patient Active Problem List   Diagnosis Date Noted  . Arthritis of right hand 11/24/2018  . History of total knee replacement, left 01/24/2017  . Sensorineural hearing loss (SNHL), bilateral 10/25/2015  . Rectocele 05/18/2013  . Cystocele, grade 2 05/18/2013  . Postmenopausal 05/18/2013    Past Medical History:  Diagnosis Date  . Bell's palsy 06/2002  . Cystocele, grade 2    high  . Hyperlipidemia   . Hypertension   . Ischemic colitis (HCC) 10/08  . Rectocele    mod grade 2    Past Surgical History:  Procedure Laterality Date  . APPENDECTOMY  1974  . BACK SURGERY  4/96  . BREAST REDUCTION SURGERY Bilateral 9/02  . CERVICAL FUSION  1998   C5-C6  . CERVICAL SPINE SURGERY  01/25/11   cervical neck cage C2-4/5  . CHOLECYSTECTOMY, LAPAROSCOPIC  2/93  . LAMINECTOMY  12/95   L5-S1  . LAPAROSCOPIC ABDOMINAL EXPLORATION  11/00   LLQ pain, Dickstein  . LAPAROSCOPIC SALPINGO OOPHERECTOMY Right 1/98   Dickstein, endometriosis  . MENISCUS REPAIR Left 11/03/2015  . NECK SURGERY  10/03   plate in neck for herniated disc, 3/05 changed plate  . TONSILLECTOMY AND ADENOIDECTOMY  1972  . TOTAL VAGINAL HYSTERECTOMY  1990   pelvic relaxation, and endometriosis    Current Outpatient Medications  Medication Sig Dispense  Refill  . aspirin EC 81 MG tablet Take 81 mg by mouth daily.    Marland Kitchen atorvastatin (LIPITOR) 10 MG tablet Take 10 mg by mouth daily.    . cholecalciferol (VITAMIN D) 25 MCG (1000 UT) tablet Take 1 tablet (1,000 Units total) by mouth daily.    . Multiple Vitamin (MULTIVITAMIN) tablet Take 1 tablet by mouth daily.    . Omega-3 Fatty Acids (FISH OIL) 1000 MG CAPS Take by mouth.    Marland Kitchen omeprazole (PRILOSEC) 40 MG capsule Take 40 mg by mouth every morning.    Marland Kitchen telmisartan (MICARDIS) 40 MG tablet Take 40 mg by mouth daily.    Marland Kitchen triamterene-hydrochlorothiazide (DYAZIDE) 37.5-25 MG capsule Take 1 capsule by mouth daily.     No current facility-administered medications for this visit.     ALLERGIES: Ibuprofen-famotidine, Adhesive [tape], Erythromycin, and Other  Family History  Problem Relation Age of Onset  . Colon polyps Father   . Nephrolithiasis Father   . Prostate cancer Father   . Hyperlipidemia Father   . Hypertension Father   . Bladder Cancer Father 65  . Osteoporosis Mother 102  . Heart disease Mother   . Hypertension Mother   .  Hyperlipidemia Mother   . Hyperlipidemia Brother   . Hypertension Brother   . Prostate cancer Brother   . Pneumonia Maternal Grandmother   . Heart failure Maternal Grandfather   . Cancer Paternal Grandfather   . Hyperlipidemia Brother   . Hypertension Brother   . Stroke Maternal Aunt   . Heart failure Maternal Uncle   . Prostate cancer Brother 36  . Thyroid cancer Daughter     Social History   Socioeconomic History  . Marital status: Married    Spouse name: Not on file  . Number of children: 2  . Years of education: Not on file  . Highest education level: Not on file  Occupational History    Employer: [employer]  Tobacco Use  . Smoking status: Never Smoker  . Smokeless tobacco: Never Used  Vaping Use  . Vaping Use: Never used  Substance and Sexual Activity  . Alcohol use: Yes    Comment: occ wine  . Drug use: No  . Sexual  activity: Yes    Partners: Male    Birth control/protection: Post-menopausal  Other Topics Concern  . Not on file  Social History Narrative  . Not on file   Social Determinants of Health   Financial Resource Strain:   . Difficulty of Paying Living Expenses: Not on file  Food Insecurity:   . Worried About Programme researcher, broadcasting/film/video in the Last Year: Not on file  . Ran Out of Food in the Last Year: Not on file  Transportation Needs:   . Lack of Transportation (Medical): Not on file  . Lack of Transportation (Non-Medical): Not on file  Physical Activity:   . Days of Exercise per Week: Not on file  . Minutes of Exercise per Session: Not on file  Stress:   . Feeling of Stress : Not on file  Social Connections:   . Frequency of Communication with Friends and Family: Not on file  . Frequency of Social Gatherings with Friends and Family: Not on file  . Attends Religious Services: Not on file  . Active Member of Clubs or Organizations: Not on file  . Attends Banker Meetings: Not on file  . Marital Status: Not on file  Intimate Partner Violence:   . Fear of Current or Ex-Partner: Not on file  . Emotionally Abused: Not on file  . Physically Abused: Not on file  . Sexually Abused: Not on file    Review of Systems  Constitutional: Negative.   HENT: Negative.   Eyes: Negative.   Respiratory: Negative.   Cardiovascular: Negative.   Gastrointestinal: Negative.   Genitourinary: Positive for frequency and urgency.       Burning after urination  Musculoskeletal: Negative.   Skin: Negative.   Neurological: Negative.   Endo/Heme/Allergies: Negative.   Psychiatric/Behavioral: Negative.     PHYSICAL EXAMINATION:    BP 122/70 (BP Location: Right Arm, Patient Position: Sitting, Cuff Size: Normal)   Pulse 88   Resp 16   Ht 5\' 7"  (1.702 m)   Wt 219 lb (99.3 kg)   LMP 03/26/1988 (Approximate)   BMI 34.30 kg/m     General appearance: alert, cooperative and appears stated  age Abdomen: soft, non-tender; non distended, no masses,  no organomegaly CVA: not tender   Urine dip: trace blood, 2+ leuk  ASSESSMENT Cystitis, 2nd episode this year    PLAN Send urine for ua, c&s Bactrim and pyridium Call if she isn't better in 48 hours.

## 2020-01-20 NOTE — Patient Instructions (Signed)

## 2020-01-21 LAB — URINALYSIS, MICROSCOPIC ONLY
Bacteria, UA: NONE SEEN
Casts: NONE SEEN /lpf

## 2020-01-22 LAB — URINE CULTURE

## 2020-08-31 DIAGNOSIS — M25551 Pain in right hip: Secondary | ICD-10-CM | POA: Insufficient documentation

## 2020-09-05 ENCOUNTER — Ambulatory Visit: Payer: BC Managed Care – PPO | Admitting: Obstetrics and Gynecology

## 2020-11-01 ENCOUNTER — Ambulatory Visit: Payer: Medicare Other | Admitting: Obstetrics and Gynecology

## 2020-11-18 ENCOUNTER — Other Ambulatory Visit: Payer: Self-pay

## 2020-11-18 ENCOUNTER — Encounter: Payer: Self-pay | Admitting: Obstetrics and Gynecology

## 2020-11-18 ENCOUNTER — Ambulatory Visit (INDEPENDENT_AMBULATORY_CARE_PROVIDER_SITE_OTHER): Payer: Medicare Other | Admitting: Obstetrics and Gynecology

## 2020-11-18 VITALS — BP 122/84 | HR 110 | Ht 66.5 in | Wt 214.0 lb

## 2020-11-18 DIAGNOSIS — E669 Obesity, unspecified: Secondary | ICD-10-CM | POA: Insufficient documentation

## 2020-11-18 DIAGNOSIS — R1013 Epigastric pain: Secondary | ICD-10-CM | POA: Insufficient documentation

## 2020-11-18 DIAGNOSIS — R1310 Dysphagia, unspecified: Secondary | ICD-10-CM | POA: Insufficient documentation

## 2020-11-18 DIAGNOSIS — K2990 Gastroduodenitis, unspecified, without bleeding: Secondary | ICD-10-CM | POA: Insufficient documentation

## 2020-11-18 DIAGNOSIS — N8111 Cystocele, midline: Secondary | ICD-10-CM

## 2020-11-18 DIAGNOSIS — R350 Frequency of micturition: Secondary | ICD-10-CM

## 2020-11-18 DIAGNOSIS — N816 Rectocele: Secondary | ICD-10-CM

## 2020-11-18 DIAGNOSIS — K5900 Constipation, unspecified: Secondary | ICD-10-CM | POA: Insufficient documentation

## 2020-11-18 DIAGNOSIS — Z01419 Encounter for gynecological examination (general) (routine) without abnormal findings: Secondary | ICD-10-CM

## 2020-11-18 DIAGNOSIS — Z1211 Encounter for screening for malignant neoplasm of colon: Secondary | ICD-10-CM | POA: Insufficient documentation

## 2020-11-18 DIAGNOSIS — K219 Gastro-esophageal reflux disease without esophagitis: Secondary | ICD-10-CM | POA: Insufficient documentation

## 2020-11-18 NOTE — Progress Notes (Signed)
70 y.o. G6P2002 Married White or Caucasian Not Hispanic or Latino female here for breast and pelvic.  H/O TVH/H/O RSO. Known cystocele and rectocele.  She has some urgency and frequency to void, voids normal amounts. Occasional GSI, tolerable. No bowel issues. Sexually active, no pain. No vaginal bleeding.     Patient's last menstrual period was 03/26/1988 (approximate).          Sexually active: Yes.    The current method of family planning is status post hysterectomy.    Exercising: Yes.     Walking  Smoker:  no  Health Maintenance: Pap:   04/10/00 WNL ([facility]) History of abnormal Pap:  no MMG:  11/23/20 negative; 10/17/20 had incomplete mammo 7/29 had repeat , 8/22 breast aspiration finding was cyst. Reports have been requested from [facility].  BMD:   02/02/2015: Normal, -0.60 Spine / +0.30 Right Femur Neck / -0.40 Left Femur Neck  Colonoscopy: 07/2017 WNL  TDaP:  UTD with primary Gardasil: n/a   reports that she has never smoked. She has never used smokeless tobacco. She reports current alcohol use. She reports that she does not use drugs. Rare ETOH. 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 She is from [place], moved here over 30 years ago. 2 kids and 2 grand kids (all local).  Past Medical History:  Diagnosis Date   Bell's palsy 06/2002   Cystocele, grade 2    high   Hyperlipidemia    Hypertension    Ischemic colitis (HCC) 10/08   Rectocele    mod grade 2    Past Surgical History:  Procedure Laterality Date   APPENDECTOMY  1974   BACK SURGERY  4/96   BREAST REDUCTION SURGERY Bilateral 9/02   CERVICAL FUSION  1998   C5-C6   CERVICAL SPINE SURGERY  01/25/11   cervical neck cage C2-4/5   CHOLECYSTECTOMY, LAPAROSCOPIC  2/93   LAMINECTOMY  12/95   L5-S1   LAPAROSCOPIC ABDOMINAL EXPLORATION  11/00   LLQ pain, Dickstein   LAPAROSCOPIC SALPINGO OOPHERECTOMY Right 1/98   Dickstein, endometriosis   MENISCUS REPAIR Left 11/03/2015   NECK SURGERY   10/03   plate in neck for herniated disc, 3/05 changed plate   TONSILLECTOMY AND ADENOIDECTOMY  1972   TOTAL VAGINAL HYSTERECTOMY  1990   pelvic relaxation, and endometriosis    Current Outpatient Medications  Medication Sig Dispense Refill   aspirin EC 81 MG tablet Take 81 mg by mouth daily.     atorvastatin (LIPITOR) 10 MG tablet Take 10 mg by mouth daily.     cholecalciferol (VITAMIN D) 25 MCG (1000 UT) tablet Take 1 tablet (1,000 Units total) by mouth daily.     Multiple Vitamin (MULTIVITAMIN) tablet Take 1 tablet by mouth daily.     Omega-3 Fatty Acids (FISH OIL) 1000 MG CAPS Take by mouth.     omeprazole (PRILOSEC) 40 MG capsule Take 40 mg by mouth every morning.     telmisartan (MICARDIS) 40 MG tablet Take 40 mg by mouth daily.     triamterene-hydrochlorothiazide (DYAZIDE) 37.5-25 MG capsule Take 1 capsule by mouth daily.     No current facility-administered medications for this visit.    Family History  Problem Relation Age of Onset   Colon polyps Father    Nephrolithiasis Father    Prostate cancer Father    Hyperlipidemia Father    Hypertension Father    Bladder Cancer Father 21   Osteoporosis Mother 31  Heart disease Mother    Hypertension Mother    Hyperlipidemia Mother    Hyperlipidemia Brother    Hypertension Brother    Prostate cancer Brother    Pneumonia Maternal Grandmother    Heart failure Maternal Grandfather    Cancer Paternal Grandfather    Hyperlipidemia Brother    Hypertension Brother    Stroke Maternal Aunt    Heart failure Maternal Uncle    Prostate cancer Brother 18   Thyroid cancer Daughter     Review of Systems  All other systems reviewed and are negative.  Exam:   BP 122/84   Pulse (!) 110   Ht 5' 6.5" (1.689 m)   Wt 214 lb (97.1 kg)   LMP 03/26/1988 (Approximate)   SpO2 99%   BMI 34.02 kg/m   Weight change: @WEIGHTCHANGE @ Height:   Height: 5' 6.5" (168.9 cm)  Ht Readings from Last 3 Encounters:  11/18/20 5' 6.5" (1.689 m)   01/20/20 5\' 7"  (1.702 m)  09/03/19 5\' 6"  (1.676 m)    General appearance: alert, cooperative and appears stated age Head: Normocephalic, without obvious abnormality, atraumatic Neck: no adenopathy, supple, symmetrical, trachea midline and thyroid normal to inspection and palpation Lungs: clear to auscultation bilaterally Cardiovascular: regular rate and rhythm Breasts: normal appearance, no masses or tenderness Abdomen: soft, non-tender; non distended,  no masses,  no organomegaly Extremities: extremities normal, atraumatic, no cyanosis or edema Skin: Skin color, texture, turgor normal. No rashes or lesions Lymph nodes: Cervical, supraclavicular, and axillary nodes normal. No abnormal inguinal nodes palpated Neurologic: Grossly normal   Pelvic: External genitalia:  no lesions              Urethra:  normal appearing urethra with no masses, tenderness or lesions              Bartholins and Skenes: normal                 Vagina: atrophic appearing vagina with grade 2 cystocele, small grade              Cervix: absent               Bimanual Exam:  Uterus:  uterus absent              Adnexa: no mass, fullness, tenderness               Rectovaginal: Confirms               Anus:  normal sphincter tone, no lesions  chaperoned for the exam.  1. Gynecologic exam normal Discussed breast self exam Discussed calcium and vit D intake Labs with primary Mammogram UTD Colonoscopy UTD  2. Midline cystocele Stable, not bothersome  3. Rectocele Stable, not bothersome  4. Urinary frequency Some symptoms of OAB, reports voiding normal amounts.  Bladder training information given Discussed 11/03/19

## 2020-11-18 NOTE — Patient Instructions (Signed)

## 2021-01-09 ENCOUNTER — Encounter: Payer: Self-pay | Admitting: Obstetrics and Gynecology

## 2021-08-24 HISTORY — PX: TOTAL HIP ARTHROPLASTY: SHX124

## 2022-01-23 NOTE — Progress Notes (Deleted)
71 y.o. G2P2002 Married White or Caucasian Not Hispanic or Latino female here for annual exam.      Patient's last menstrual period was 03/26/1988 (approximate).          Sexually active: {yes no:314532}  The current method of family planning is status post hysterectomy.    Exercising: {yes no:314532}  {types:19826} Smoker:  {YES [id number]  Health Maintenance: Pap:   04/10/00 WNL ([facility]) History of abnormal Pap:  no MMG:   11/23/20 negative; 10/17/20 had incomplete mammo 7/29 had repeat , 8/22 breast aspiration finding was cyst. Reports have been requested from [facility].  BMD:   12/14/20  normal  Colonoscopy: 07/2017 WNL  TDaP:  UTD with primary Gardasil: n/a     reports that she has never smoked. She has never used smokeless tobacco. She reports current alcohol use. She reports that she does not use drugs.  Past Medical History:  Diagnosis Date   Bell's palsy 06/2002   Cystocele, grade 2    high   Hyperlipidemia    Hypertension    Ischemic colitis (HCC) 10/08   Rectocele    mod grade 2    Past Surgical History:  Procedure Laterality Date   APPENDECTOMY  1974   BACK SURGERY  4/96   BREAST REDUCTION SURGERY Bilateral 9/02   CERVICAL FUSION  1998   C5-C6   CERVICAL SPINE SURGERY  01/25/11   cervical neck cage C2-4/5   CHOLECYSTECTOMY, LAPAROSCOPIC  2/93   LAMINECTOMY  12/95   L5-S1   LAPAROSCOPIC ABDOMINAL EXPLORATION  11/00   LLQ pain, Dickstein   LAPAROSCOPIC SALPINGO OOPHERECTOMY Right 1/98   Dickstein, endometriosis   MENISCUS REPAIR Left 11/03/2015   NECK SURGERY  10/03   plate in neck for herniated disc, 3/05 changed plate   TONSILLECTOMY AND ADENOIDECTOMY  1972   TOTAL VAGINAL HYSTERECTOMY  1990   pelvic relaxation, and endometriosis    Current Outpatient Medications  Medication Sig Dispense Refill   aspirin EC 81 MG tablet Take 81 mg by mouth daily.     atorvastatin (LIPITOR) 10 MG tablet Take 10 mg by mouth daily.     cholecalciferol (VITAMIN D) 25 MCG (1000  UT) tablet Take 1 tablet (1,000 Units total) by mouth daily.     Multiple Vitamin (MULTIVITAMIN) tablet Take 1 tablet by mouth daily.     Omega-3 Fatty Acids (FISH OIL) 1000 MG CAPS Take by mouth.     omeprazole (PRILOSEC) 40 MG capsule Take 40 mg by mouth every morning.     telmisartan (MICARDIS) 40 MG tablet Take 40 mg by mouth daily.     triamterene-hydrochlorothiazide (DYAZIDE) 37.5-25 MG capsule Take 1 capsule by mouth daily.     No current facility-administered medications for this visit.    Family History  Problem Relation Age of Onset   Colon polyps Father    Nephrolithiasis Father    Prostate cancer Father    Hyperlipidemia Father    Hypertension Father    Bladder Cancer Father 85   Osteoporosis Mother 61   Heart disease Mother    Hypertension Mother    Hyperlipidemia Mother    Hyperlipidemia Brother    Hypertension Brother    Prostate cancer Brother    Pneumonia Maternal Grandmother    Heart failure Maternal Grandfather    Cancer Paternal Grandfather    Hyperlipidemia Brother    Hypertension Brother    Stroke Maternal Aunt    Heart failure Maternal Uncle    Prostate cancer  Brother 77   Thyroid cancer Daughter     Review of Systems  Exam:   LMP 03/26/1988 (Approximate)   Weight change: @WEIGHTCHANGE @ Height:      Ht Readings from Last 3 Encounters:  11/18/20 5' 6.5" (1.689 m)  01/20/20 5\' 7"  (1.702 m)  09/03/19 5\' 6"  (1.676 m)    General appearance: alert, cooperative and appears stated age Head: Normocephalic, without obvious abnormality, atraumatic Neck: no adenopathy, supple, symmetrical, trachea midline and thyroid {CHL AMB PHY EX THYROID NORM DEFAULT:[id number]::"normal to inspection and palpation"} Lungs: clear to auscultation bilaterally Cardiovascular: regular rate and rhythm Breasts: {Exam; breast:13139::"normal appearance, no masses or tenderness"} Abdomen: soft, non-tender; non distended,  no masses,  no organomegaly Extremities: extremities  normal, atraumatic, no cyanosis or edema Skin: Skin color, texture, turgor normal. No rashes or lesions Lymph nodes: Cervical, supraclavicular, and axillary nodes normal. No abnormal inguinal nodes palpated Neurologic: Grossly normal   Pelvic: External genitalia:  no lesions              Urethra:  normal appearing urethra with no masses, tenderness or lesions              Bartholins and Skenes: normal                 Vagina: normal appearing vagina with normal color and discharge, no lesions              Cervix: {CHL AMB PHY EX CERVIX NORM DEFAULT:[phone]::"no lesions"}               Bimanual Exam:  Uterus:  {CHL AMB PHY EX UTERUS NORM DEFAULT:[phone]::"normal size, contour, position, consistency, mobility, non-tender"}              Adnexa: {CHL AMB PHY EX ADNEXA NO MASS DEFAULT:[phone]::"no mass, fullness, tenderness"}               Rectovaginal: Confirms               Anus:  normal sphincter tone, no lesions  *** chaperoned for the exam.  A:  Well Woman with normal exam  P:

## 2022-01-29 ENCOUNTER — Ambulatory Visit: Payer: Medicare Other | Admitting: Obstetrics and Gynecology

## 2022-11-21 ENCOUNTER — Ambulatory Visit: Payer: Medicare Other | Admitting: Obstetrics and Gynecology

## 2022-12-06 DIAGNOSIS — J1282 Pneumonia due to coronavirus disease 2019: Secondary | ICD-10-CM

## 2022-12-06 HISTORY — DX: Pneumonia due to coronavirus disease 2019: J12.82

## 2023-01-14 NOTE — Progress Notes (Signed)
72 y.o. G8P2002 Married Caucasian female here for breast and pelvic exam.   She is followed for a cystocele and rectocele.  Voiding well.  No urinary leakage.  Wears a panty liner just in case she has urgency.  No constipation or fecal incontinence.   Uses a lubricant for intercourse.   Retired.  Has 2 children and 2 grandchildren.  Grandchildren just moved to [facility].   PCP: Wilfrid Lund, PA   Patient's last menstrual period was 03/26/1988 (approximate).           Sexually active: Yes.    The current method of family planning is status post hysterectomy.    Exercising: Yes.     Exercise at home Smoker:  no  OB History  Gravida Para Term Preterm AB Living  2 2 2  0 0 2  SAB IAB Ectopic Multiple Live Births  0 0 0 0 2    # Outcome Date GA Lbr Len/2nd Weight Sex Type Anes PTL Lv  2 Term 1983   8 lb 9 oz (3.884 kg) M Vag-Spont   LIV  1 Term 1979   9 lb 5 oz (4.224 kg) F Vag-Forceps   LIV     Health Maintenance: Pap:  04/10/00 WNL ([facility])  History of abnormal Pap:  no MMG: 01/17/23 WNL per pt - [facility].  Colonoscopy:  2018 - Dr.  Loreta Ave - normal per patient.  She had an endoscopy in 2025.  BMD:  01/09/21  Result  WNL  HIV: 03/21/16 NR Hep C: 03/21/16 neg  Immunization History  Administered Date(s) Administered   Influenza-Unspecified 12/25/2015   PFIZER(Purple Top)SARS-COV-2 Vaccination 04/18/2019, 05/09/2019   Tdap 03/26/2009  Flu vaccine - completed.    reports that she has never smoked. She has never used smokeless tobacco. She reports current alcohol use. She reports that she does not use drugs.  Past Medical History:  Diagnosis Date   Bell's palsy 06/25/2002   Cystocele, grade 2    high   Hyperlipidemia    Hypertension    Ischemic colitis (HCC) 12/25/2006   Pneumonia due to COVID-19 virus 12/06/2022   Rectocele    mod grade 2    Past Surgical History:  Procedure Laterality Date   APPENDECTOMY  1974   BACK SURGERY  06/1994   BREAST REDUCTION  SURGERY Bilateral 11/2000   CERVICAL FUSION  1998   C5-C6   CERVICAL SPINE SURGERY  01/25/2011   cervical neck cage C2-4/5   CHOLECYSTECTOMY, LAPAROSCOPIC  04/1991   LAMINECTOMY  02/1994   L5-S1   LAPAROSCOPIC ABDOMINAL EXPLORATION  01/1999   LLQ pain, Dickstein   LAPAROSCOPIC SALPINGO OOPHERECTOMY Right 03/1996   Dickstein, endometriosis   MENISCUS REPAIR Left 11/03/2015   NECK SURGERY  12/2001   plate in neck for herniated disc, 3/05 changed plate   TONSILLECTOMY AND ADENOIDECTOMY  1972   TOTAL HIP ARTHROPLASTY Right 08/2021   TOTAL VAGINAL HYSTERECTOMY  1990   pelvic relaxation, and endometriosis    Current Outpatient Medications  Medication Sig Dispense Refill   aspirin EC 81 MG tablet Take 81 mg by mouth daily.     atorvastatin (LIPITOR) 10 MG tablet Take 10 mg by mouth daily.     cholecalciferol (VITAMIN D) 25 MCG (1000 UT) tablet Take 1 tablet (1,000 Units total) by mouth daily.     Multiple Vitamin (MULTIVITAMIN) tablet Take 1 tablet by mouth daily.     Omega-3 Fatty Acids (FISH OIL) 1000 MG CAPS Take by  mouth.     omeprazole (PRILOSEC) 40 MG capsule Take 40 mg by mouth every morning.     telmisartan (MICARDIS) 40 MG tablet Take 40 mg by mouth daily.     triamterene-hydrochlorothiazide (DYAZIDE) 37.5-25 MG capsule Take 1 capsule by mouth daily.     No current facility-administered medications for this visit.    Family History  Problem Relation Age of Onset   Colon polyps Father    Nephrolithiasis Father    Prostate cancer Father    Hyperlipidemia Father    Hypertension Father    Bladder Cancer Father 63   Osteoporosis Mother 10   Heart disease Mother    Hypertension Mother    Hyperlipidemia Mother    Hyperlipidemia Brother    Hypertension Brother    Prostate cancer Brother    Pneumonia Maternal Grandmother    Heart failure Maternal Grandfather    Cancer Paternal Grandfather    Hyperlipidemia Brother    Hypertension Brother    Stroke Maternal Aunt     Heart failure Maternal Uncle    Prostate cancer Brother 1   Thyroid cancer Daughter     Review of Systems  All other systems reviewed and are negative.   Exam:   BP 124/84 (BP Location: Left Arm, Patient Position: Sitting, Cuff Size: Normal)   Pulse (!) 104   Ht 5' 6.5" (1.689 m)   Wt 201 lb (91.2 kg)   LMP 03/26/1988 (Approximate)   SpO2 98%   BMI 31.96 kg/m     General appearance: alert, cooperative and appears stated age Head: normocephalic, without obvious abnormality, atraumatic Neck: no adenopathy, supple, symmetrical, trachea midline and thyroid normal to inspection and palpation Lungs: clear to auscultation bilaterally Breasts: consistent with reduction, no masses or tenderness, No nipple retraction or dimpling, No nipple discharge or bleeding, No axillary adenopathy Heart: regular rate and rhythm Abdomen: soft, non-tender; no masses, no organomegaly Extremities: extremities normal, atraumatic, no cyanosis or edema Skin: skin color, texture, turgor normal. No rashes or lesions Lymph nodes: cervical, supraclavicular, and axillary nodes normal. Neurologic: grossly normal  Pelvic: External genitalia:  no lesions              No abnormal inguinal nodes palpated.              Urethra:  normal appearing urethra with no masses, tenderness or lesions              Bartholins and Skenes: normal                 Vagina: normal appearing vagina with normal color and discharge, no lesions.  Second degree cystocele and second degree rectocele.               Cervix: absent              Pap taken: no Bimanual Exam:  Uterus:  absent              Adnexa: no mass, fullness, tenderness              Rectal exam: yes.  Confirms.              Anus:  normal sphincter tone, no lesions  Chaperone was present for exam:  Warren Lacy, CMA   Assessment and Plan:   Encounter for breast and pelvic exam.  Status post TVH.  Endometriosis.  Status post RSO. Left ovary remain.  Second degree  cystocele and second degree rectocele.  Stable.  Status post bilateral breast reduction.   Plan:   Mammogram screening discussed.  Will get copies of mammograms from [facility] from 2023 and 2024.  Self breast awareness reviewed. Guidelines for Calcium, Vitamin D, regular exercise program including cardiovascular and weight bearing exercise. We discussed her cystocele and rectocele.  Tx options of pessary, pelvic floor therapy, and surgery briefly reviewed.  She will watch for increased difficulty with voiding or having BMs, having urinary tract infections, or urinary incontinence.    Return in about 2 years (around 01/27/2025) for breast and pelvic exam - Dr. Edward Jolly.   After visit summary provided.   20 min  total time was spent for this patient encounter, including preparation, face-to-face counseling with the patient, coordination of care, and documentation of the encounter in addition to doing a breast and pelvic exam.

## 2023-01-28 ENCOUNTER — Encounter: Payer: Self-pay | Admitting: Obstetrics and Gynecology

## 2023-01-28 ENCOUNTER — Ambulatory Visit (INDEPENDENT_AMBULATORY_CARE_PROVIDER_SITE_OTHER): Payer: Medicare Other | Admitting: Obstetrics and Gynecology

## 2023-01-28 VITALS — BP 124/84 | HR 104 | Ht 66.5 in | Wt 201.0 lb

## 2023-01-28 DIAGNOSIS — Z01419 Encounter for gynecological examination (general) (routine) without abnormal findings: Secondary | ICD-10-CM | POA: Diagnosis not present

## 2023-01-28 DIAGNOSIS — N816 Rectocele: Secondary | ICD-10-CM | POA: Diagnosis not present

## 2023-01-28 DIAGNOSIS — N8110 Cystocele, unspecified: Secondary | ICD-10-CM

## 2023-01-28 NOTE — Patient Instructions (Signed)

## 2023-03-05 ENCOUNTER — Other Ambulatory Visit: Payer: Self-pay | Admitting: Gastroenterology

## 2023-03-05 DIAGNOSIS — R1310 Dysphagia, unspecified: Secondary | ICD-10-CM

## 2023-03-26 ENCOUNTER — Ambulatory Visit
Admission: RE | Admit: 2023-03-26 | Discharge: 2023-03-26 | Disposition: A | Discharge status: Home / Self Care | Payer: Medicare Other | Source: Ambulatory Visit | Attending: Gastroenterology | Admitting: Gastroenterology

## 2023-03-26 DIAGNOSIS — R1310 Dysphagia, unspecified: Secondary | ICD-10-CM
